# Patient Record
Sex: Female | Born: 1945
Health system: Southern US, Community
[De-identification: ages and names within clinical notes are randomized; demographics above are authoritative.]

## PROBLEM LIST (undated history)

## (undated) DIAGNOSIS — E669 Obesity, unspecified: Secondary | ICD-10-CM

## (undated) DIAGNOSIS — T8859XA Other complications of anesthesia, initial encounter: Secondary | ICD-10-CM

## (undated) DIAGNOSIS — F419 Anxiety disorder, unspecified: Secondary | ICD-10-CM

## (undated) DIAGNOSIS — T4145XA Adverse effect of unspecified anesthetic, initial encounter: Secondary | ICD-10-CM

## (undated) DIAGNOSIS — R569 Unspecified convulsions: Secondary | ICD-10-CM

## (undated) DIAGNOSIS — K219 Gastro-esophageal reflux disease without esophagitis: Secondary | ICD-10-CM

## (undated) DIAGNOSIS — I10 Essential (primary) hypertension: Secondary | ICD-10-CM

## (undated) DIAGNOSIS — E039 Hypothyroidism, unspecified: Secondary | ICD-10-CM

## (undated) DIAGNOSIS — M1712 Unilateral primary osteoarthritis, left knee: Secondary | ICD-10-CM

## (undated) DIAGNOSIS — M1990 Unspecified osteoarthritis, unspecified site: Secondary | ICD-10-CM

## (undated) DIAGNOSIS — R0602 Shortness of breath: Secondary | ICD-10-CM

## (undated) DIAGNOSIS — G500 Trigeminal neuralgia: Secondary | ICD-10-CM

## (undated) DIAGNOSIS — IMO0001 Reserved for inherently not codable concepts without codable children: Secondary | ICD-10-CM

## (undated) DIAGNOSIS — G4733 Obstructive sleep apnea (adult) (pediatric): Secondary | ICD-10-CM

## (undated) DIAGNOSIS — F32A Depression, unspecified: Secondary | ICD-10-CM

## (undated) DIAGNOSIS — I890 Lymphedema, not elsewhere classified: Secondary | ICD-10-CM

## (undated) DIAGNOSIS — F329 Major depressive disorder, single episode, unspecified: Secondary | ICD-10-CM

## (undated) DIAGNOSIS — E119 Type 2 diabetes mellitus without complications: Secondary | ICD-10-CM

## (undated) DIAGNOSIS — E559 Vitamin D deficiency, unspecified: Secondary | ICD-10-CM

## (undated) DIAGNOSIS — R32 Unspecified urinary incontinence: Secondary | ICD-10-CM

## (undated) DIAGNOSIS — E7800 Pure hypercholesterolemia, unspecified: Secondary | ICD-10-CM

## (undated) DIAGNOSIS — J189 Pneumonia, unspecified organism: Secondary | ICD-10-CM

## (undated) DIAGNOSIS — D649 Anemia, unspecified: Secondary | ICD-10-CM

## (undated) DIAGNOSIS — M1711 Unilateral primary osteoarthritis, right knee: Secondary | ICD-10-CM

## (undated) DIAGNOSIS — Z9114 Patient's other noncompliance with medication regimen: Secondary | ICD-10-CM

## (undated) HISTORY — DX: Obesity, unspecified: E66.9

## (undated) HISTORY — DX: Unspecified osteoarthritis, unspecified site: M19.90

## (undated) HISTORY — DX: Unspecified convulsions: R56.9

## (undated) HISTORY — PX: TONSILLECTOMY: SUR1361

## (undated) HISTORY — DX: Trigeminal neuralgia: G50.0

## (undated) HISTORY — DX: Shortness of breath: R06.02

## (undated) HISTORY — DX: Obstructive sleep apnea (adult) (pediatric): G47.33

## (undated) HISTORY — DX: Gastro-esophageal reflux disease without esophagitis: K21.9

## (undated) HISTORY — PX: SHOULDER SURGERY: SHX246

## (undated) HISTORY — DX: Pure hypercholesterolemia, unspecified: E78.00

## (undated) HISTORY — DX: Type 2 diabetes mellitus without complications: E11.9

## (undated) HISTORY — DX: Lymphedema, not elsewhere classified: I89.0

## (undated) HISTORY — PX: CHOLECYSTECTOMY: SHX55

## (undated) HISTORY — PX: CARPAL TUNNEL RELEASE: SHX101

## (undated) HISTORY — DX: Vitamin D deficiency, unspecified: E55.9

## (undated) HISTORY — DX: Anemia, unspecified: D64.9

## (undated) HISTORY — DX: Reserved for inherently not codable concepts without codable children: IMO0001

## (undated) HISTORY — DX: Hypothyroidism, unspecified: E03.9

## (undated) HISTORY — DX: Major depressive disorder, single episode, unspecified: F32.9

## (undated) HISTORY — DX: Depression, unspecified: F32.A

## (undated) HISTORY — PX: OVARIAN CYST REMOVAL: SHX89

## (undated) HISTORY — PX: TUBAL LIGATION: SHX77

## (undated) HISTORY — PX: COLONOSCOPY: SHX174

## (undated) HISTORY — PX: APPENDECTOMY: SHX54

## (undated) HISTORY — DX: Essential (primary) hypertension: I10

---

## 2007-05-30 ENCOUNTER — Encounter: Admission: RE | Admit: 2007-05-30 | Discharge: 2007-07-20 | Payer: Self-pay | Admitting: *Deleted

## 2010-06-29 ENCOUNTER — Encounter: Admission: RE | Admit: 2010-06-29 | Discharge: 2010-06-29 | Payer: Self-pay | Admitting: *Deleted

## 2010-09-01 ENCOUNTER — Other Ambulatory Visit: Admission: RE | Admit: 2010-09-01 | Discharge: 2010-09-01 | Payer: Self-pay | Admitting: *Deleted

## 2010-10-08 ENCOUNTER — Encounter: Admission: RE | Admit: 2010-10-08 | Discharge: 2010-10-08 | Payer: Self-pay | Admitting: Orthopedic Surgery

## 2010-10-15 ENCOUNTER — Inpatient Hospital Stay (HOSPITAL_COMMUNITY): Admission: RE | Admit: 2010-10-15 | Discharge: 2010-10-18 | Payer: Self-pay | Admitting: Orthopedic Surgery

## 2011-03-03 LAB — CBC
HCT: 29 % — ABNORMAL LOW (ref 36.0–46.0)
HCT: 30.8 % — ABNORMAL LOW (ref 36.0–46.0)
Hemoglobin: 9.6 g/dL — ABNORMAL LOW (ref 12.0–15.0)
MCV: 92.7 fL (ref 78.0–100.0)
MCV: 95.1 fL (ref 78.0–100.0)
Platelets: 186 10*3/uL (ref 150–400)
Platelets: 227 10*3/uL (ref 150–400)
RBC: 3.24 MIL/uL — ABNORMAL LOW (ref 3.87–5.11)
RBC: 4.08 MIL/uL (ref 3.87–5.11)
RDW: 12.8 % (ref 11.5–15.5)
RDW: 13.1 % (ref 11.5–15.5)
WBC: 10.1 10*3/uL (ref 4.0–10.5)
WBC: 7.3 10*3/uL (ref 4.0–10.5)
WBC: 8.4 10*3/uL (ref 4.0–10.5)

## 2011-03-03 LAB — PROTIME-INR
INR: 1.01 (ref 0.00–1.49)
Prothrombin Time: 13.5 seconds (ref 11.6–15.2)

## 2011-03-03 LAB — BASIC METABOLIC PANEL
BUN: 12 mg/dL (ref 6–23)
BUN: 15 mg/dL (ref 6–23)
Calcium: 9.8 mg/dL (ref 8.4–10.5)
Chloride: 101 mEq/L (ref 96–112)
Chloride: 102 mEq/L (ref 96–112)
Chloride: 103 mEq/L (ref 96–112)
Creatinine, Ser: 1 mg/dL (ref 0.4–1.2)
GFR calc Af Amer: >60 mL/min (ref >60)
GFR calc Af Amer: >60 mL/min (ref >60)
GFR calc non Af Amer: 56 mL/min — ABNORMAL LOW (ref >60)
GFR calc non Af Amer: >60 mL/min (ref >60)
GFR calc non Af Amer: >60 mL/min (ref >60)
Glucose, Bld: 117 mg/dL — ABNORMAL HIGH (ref 70–99)
Potassium: 4.4 mEq/L (ref 3.5–5.1)
Potassium: 5 mEq/L (ref 3.5–5.1)
Sodium: 133 mEq/L — ABNORMAL LOW (ref 135–145)

## 2011-03-03 LAB — APTT: aPTT: 28 seconds (ref 24–37)

## 2011-03-03 LAB — GLUCOSE, CAPILLARY: Glucose-Capillary: 175 mg/dL — ABNORMAL HIGH (ref 70–99)

## 2011-03-03 LAB — MRSA PCR SCREENING: MRSA by PCR: NEGATIVE

## 2011-03-03 LAB — SURGICAL PCR SCREEN: Staphylococcus aureus: POSITIVE — AB

## 2011-03-03 LAB — TYPE AND SCREEN: Antibody Screen: NEGATIVE

## 2011-07-26 ENCOUNTER — Other Ambulatory Visit: Payer: Self-pay | Admitting: Orthopedic Surgery

## 2011-07-26 DIAGNOSIS — M1990 Unspecified osteoarthritis, unspecified site: Secondary | ICD-10-CM

## 2011-07-27 ENCOUNTER — Ambulatory Visit
Admission: RE | Admit: 2011-07-27 | Discharge: 2011-07-27 | Disposition: A | Discharge status: Home / Self Care | Payer: 59 | Source: Ambulatory Visit | Attending: Orthopedic Surgery | Admitting: Orthopedic Surgery

## 2011-07-27 DIAGNOSIS — M1990 Unspecified osteoarthritis, unspecified site: Secondary | ICD-10-CM

## 2011-07-28 ENCOUNTER — Encounter (HOSPITAL_COMMUNITY)
Admission: RE | Admit: 2011-07-28 | Discharge: 2011-07-28 | Disposition: A | Discharge status: Home / Self Care | Payer: 59 | Source: Ambulatory Visit | Attending: Orthopedic Surgery | Admitting: Orthopedic Surgery

## 2011-07-28 ENCOUNTER — Other Ambulatory Visit (HOSPITAL_COMMUNITY): Payer: Self-pay | Admitting: Orthopedic Surgery

## 2011-07-28 ENCOUNTER — Ambulatory Visit (HOSPITAL_COMMUNITY)
Admission: RE | Admit: 2011-07-28 | Discharge: 2011-07-28 | Disposition: A | Discharge status: Home / Self Care | Payer: 59 | Source: Ambulatory Visit | Attending: Orthopedic Surgery | Admitting: Orthopedic Surgery

## 2011-07-28 DIAGNOSIS — M19011 Primary osteoarthritis, right shoulder: Secondary | ICD-10-CM

## 2011-07-28 DIAGNOSIS — Z01811 Encounter for preprocedural respiratory examination: Secondary | ICD-10-CM | POA: Insufficient documentation

## 2011-07-28 DIAGNOSIS — Z01818 Encounter for other preprocedural examination: Secondary | ICD-10-CM | POA: Insufficient documentation

## 2011-07-28 DIAGNOSIS — Z01812 Encounter for preprocedural laboratory examination: Secondary | ICD-10-CM | POA: Insufficient documentation

## 2011-07-28 DIAGNOSIS — M19019 Primary osteoarthritis, unspecified shoulder: Secondary | ICD-10-CM | POA: Insufficient documentation

## 2011-07-28 LAB — COMPREHENSIVE METABOLIC PANEL
AST: 18 U/L (ref 0–37)
Albumin: 3.9 g/dL (ref 3.5–5.2)
Alkaline Phosphatase: 67 U/L (ref 39–117)
BUN: 25 mg/dL — ABNORMAL HIGH (ref 6–23)
CO2: 28 mEq/L (ref 19–32)
Calcium: 10.1 mg/dL (ref 8.4–10.5)
Chloride: 103 mEq/L (ref 96–112)
GFR calc Af Amer: >60 mL/min (ref >60)
GFR calc non Af Amer: 59 mL/min — ABNORMAL LOW (ref >60)
Glucose, Bld: 113 mg/dL — ABNORMAL HIGH (ref 70–99)
Potassium: 4.4 mEq/L (ref 3.5–5.1)
Total Bilirubin: 0.5 mg/dL (ref 0.3–1.2)

## 2011-07-28 LAB — CBC
Hemoglobin: 12.9 g/dL (ref 12.0–15.0)
MCH: 30.6 pg (ref 26.0–34.0)
MCHC: 33.9 g/dL (ref 30.0–36.0)
MCV: 90.3 fL (ref 78.0–100.0)
RBC: 4.21 MIL/uL (ref 3.87–5.11)

## 2011-07-28 LAB — SURGICAL PCR SCREEN: Staphylococcus aureus: NEGATIVE

## 2011-07-28 LAB — TYPE AND SCREEN: ABO/RH(D): A POS

## 2011-08-03 ENCOUNTER — Inpatient Hospital Stay (HOSPITAL_COMMUNITY): Payer: 59

## 2011-08-03 ENCOUNTER — Inpatient Hospital Stay (HOSPITAL_COMMUNITY)
Admission: RE | Admit: 2011-08-03 | Discharge: 2011-08-05 | DRG: 484 | Disposition: A | Discharge status: Home / Self Care | Payer: 59 | Source: Ambulatory Visit | Attending: Orthopedic Surgery | Admitting: Orthopedic Surgery

## 2011-08-03 DIAGNOSIS — M19019 Primary osteoarthritis, unspecified shoulder: Principal | ICD-10-CM | POA: Diagnosis present

## 2011-08-03 DIAGNOSIS — I10 Essential (primary) hypertension: Secondary | ICD-10-CM | POA: Diagnosis present

## 2011-08-03 DIAGNOSIS — G4730 Sleep apnea, unspecified: Secondary | ICD-10-CM | POA: Diagnosis present

## 2011-08-04 LAB — BASIC METABOLIC PANEL
BUN: 18 mg/dL (ref 6–23)
Calcium: 8.6 mg/dL (ref 8.4–10.5)
GFR calc Af Amer: >60 mL/min (ref >60)
GFR calc non Af Amer: >60 mL/min (ref >60)
Potassium: 5.3 mEq/L — ABNORMAL HIGH (ref 3.5–5.1)
Sodium: 138 mEq/L (ref 135–145)

## 2011-08-04 LAB — CBC
MCH: 31.5 pg (ref 26.0–34.0)
MCHC: 33.8 g/dL (ref 30.0–36.0)
RDW: 13.2 % (ref 11.5–15.5)

## 2011-08-04 NOTE — Op Note (Signed)
NAMEJENELLA, [name]                ACCOUNT NO.:  [id number]  MEDICAL RECORD NO.:  [id number]  LOCATION:  5017                         FACILITY:  [facility]  PHYSICIAN:  Eulas Post, MD    DATE OF BIRTH:  03-Apr-1946  DATE OF PROCEDURE:  08/03/2011 DATE OF DISCHARGE:                              OPERATIVE REPORT   SURGEON:  Eulas Post, MD  ASSISTANT:  Janace Litten, OPA-C, present and scrubbed throughout the case and critical for assistance with exposure as well as retraction, manipulation, instrumentation, and closure.  PREOPERATIVE DIAGNOSIS:  Right shoulder osteoarthritis.  POSTOPERATIVE DIAGNOSIS:  Right shoulder osteoarthritis.  OPERATIVE PROCEDURE:  Right total shoulder arthroplasty.  ANESTHESIA:  General with a regional block.  OPERATIVE IMPLANTS:  Biomet size 10 Press-Fit humeral stem with a small glenoid and a central Regenerex peg with a 42 x 18 mm Versa-Dial humeral head and 1 bag of DePuy CMW 1 bone cement for the glenoid.  PREOPERATIVE INDICATIONS:  Kathy [name] is a 65 year old woman who complained of severe right shoulder pain with arthritis and failed conservative measures.  She has previously had a left total shoulder replacement.  She elected for total shoulder replacement on the right. The risks, benefits, and alternatives were discussed before the procedure including but not limited to the risks of infection, bleeding, nerve injury, dislocation, periprosthetic fracture, the need for revision surgery, incomplete relief of pain, stiffness, cardiopulmonary complications, among others and she is willing to proceed.  OPERATIVE PROCEDURE:  The patient was brought to the operating room and placed in supine position.  General anesthesia was administered. Regional block had been performed.  She did not need an awake intubation at this time and anesthesia was able to manage her airway.  Foley was placed, and this was done on the first pass.  She was  positioned on the table in a beach-chair position and all bony prominences were padded. Due to her size, this made the case as well as positioning extremely difficult, however, I had been through this before with her and therefore had every one prepared and appropriately organized.  After a sterile prep and drape, time-out was performed and deltopectoral approach was carried out.  Cephalic vein was identified and retracted laterally.  During the portion of the case, the cephalic vein was injured such that it had to be tied off.  Access to the deep structures was extremely difficult.  I placed deep retractors and exposed the biceps tendon and then performed biceps tenodesis to the pectoralis insertion.  I then used the subscapularis tendon cutting approach and then exposed the proximal humerus and removed all the osteophytes and removed circumferentially the bone spurs as well as releasing the capsule.  I then dislocated the humeral head and then reamed up to a size 10.  I placed a size 9 on the other side, but it tended comfortably to go down, so then I positioned my jig and cut the humeral head in 30 degrees of retroversion.  Initially, this was somewhat of a conservative cut, but after trying to progress to the glenoid I found that there was still restricted access to the glenoid, so I resected slightly  more proximally.  I did not resect any of the infraspinatus and the capsular cuff went directly to the resection line.  I then turned my attention of the glenoid, and deep retractors were placed and I resected the labrum circumferentially and cleaned up the biceps cut.  Initially, I tried with the [facility] retractor, but this did not provide adequate retraction and the humeral head continued posing an obstruction through the head of the [facility], so therefore I used a Sonnabend retractor to get the humeral head out of the way.  I did get adequate access to the glenoid, and performed  circumferential releases, taking care to protect the axillary nerve.  I then drilled with my central drill, and then reamed taking care not to take too much superior bone as there was a little superior bone loss.  I also took care not to ream too deeply, in order to protect the subchondral plate.  I then placed my drill guide and I drilled my 3 holes for the pegged glenoid and then drilled my central hole for the Regenerex peg.  The posterior hole did penetrate, and I was slightly low with the placement of the glenoid, but overall satisfactory position.  Version was appropriate and neutral based on my preoperative template CT.  I then cemented the real component into place and it had excellent stability.  I used cement in the peripheral holes, but not the central hole.  I then turned my attention to the humerus.  Once the cement had cured, I delivered the humerus and protected the glenoid component and sequentially broached.  There had been some damage to the proximal humerus done by the humeral head retractors, and so my metaphysis was slightly weakened, but overall I had satisfactory bone adequate for press-fit fixation.  There was rotational stability with the implant. Therefore, I sequentially broached and the size 10 gave better hold and so therefore I malleted the size 10 real implant into place.  This had satisfactory fixation, and I did place a little bit of bone graft around the proximal neck.  I then trialed and selected the above-named component.  This was dialed in at C.  I then put the real head on, irrigated the shoulder copiously, reduced it, and turned my attention to closure.  Appropriate soft tissue tension had been restored.  I repaired the subscapularis with interrupted #2 FiberWire in a figure-of-eight pattern, a total of 4 stitches utilized, one of which was in the rotator interval.  Excellent tendon repair had been achieved.  I irrigated again copiously  and repaired the deltopectoral interval with Vicryl followed by Vicryl and Monocryl for the skin with Steri-Strips and sterile gauze.  She was awakened and returned to PACU in stable and satisfactory condition. There were no complications.  She tolerated the procedure well.     Eulas Post, MD     JPL/MEDQ  D:  08/03/2011  T:  08/03/2011  Job:  [id number]  Electronically Signed by Teryl Lucy MD on 08/04/2011 08:05:24 AM

## 2011-08-06 NOTE — Discharge Summary (Signed)
  [name], [name]                ACCOUNT NO.:  [id number]  MEDICAL RECORD NO.:  [id number]  LOCATION:  5017                         FACILITY:  [facility]  PHYSICIAN:  Eulas Post, MD    DATE OF BIRTH:  [birth year]  DATE OF ADMISSION:  08/03/2011 DATE OF DISCHARGE:  08/05/2011                              DISCHARGE SUMMARY   ADMISSION DIAGNOSES: 1. Right shoulder osteoarthritis. 2. Morbid obesity.  DISCHARGE DIAGNOSES: 1. Right shoulder osteoarthritis. 2. Morbid obesity.  PRIMARY PROCEDURE:  Right total shoulder arthroplasty.  HOSPITAL COURSE:  Ms. Kathy Young is a 65 year old morbidly obese woman who elected for right total shoulder arthroplasty.  She tolerated procedure well and postoperatively did not have any complications.  She was given perioperative antibiotics for antimicrobial prophylaxis.  She was given sequential compression devices and early ambulation for DVT prophylaxis.  Her pain was initially controlled with a Dilaudid PCA and then switched to p.o. analgesics.  She was tolerating p.o., and able to ambulate, and is planned to be discharged home with followup with me in 2 weeks.  There were no complications.  She tolerated the procedure well.  She benefited maximally from her hospital stay and was improved upon discharge.     Eulas Post, MD     JPL/MEDQ  D:  08/05/2011  T:  08/06/2011  Job:  [id number]  Electronically Signed by Teryl Lucy MD on 08/06/2011 02:39:41 PM

## 2013-07-31 ENCOUNTER — Other Ambulatory Visit: Payer: Self-pay | Admitting: Family Medicine

## 2013-07-31 DIAGNOSIS — H539 Unspecified visual disturbance: Secondary | ICD-10-CM

## 2013-07-31 DIAGNOSIS — R531 Weakness: Secondary | ICD-10-CM

## 2013-08-07 ENCOUNTER — Ambulatory Visit
Admission: RE | Admit: 2013-08-07 | Discharge: 2013-08-07 | Disposition: A | Discharge status: Home / Self Care | Payer: 59 | Source: Ambulatory Visit | Attending: Family Medicine | Admitting: Family Medicine

## 2013-08-07 DIAGNOSIS — R531 Weakness: Secondary | ICD-10-CM

## 2013-08-07 DIAGNOSIS — H539 Unspecified visual disturbance: Secondary | ICD-10-CM

## 2013-11-23 ENCOUNTER — Other Ambulatory Visit: Payer: Self-pay | Admitting: Family Medicine

## 2013-11-23 DIAGNOSIS — Z1231 Encounter for screening mammogram for malignant neoplasm of breast: Secondary | ICD-10-CM

## 2014-01-16 ENCOUNTER — Ambulatory Visit: Payer: 59

## 2014-08-04 ENCOUNTER — Encounter: Payer: Self-pay | Admitting: *Deleted

## 2014-11-22 ENCOUNTER — Ambulatory Visit (INDEPENDENT_AMBULATORY_CARE_PROVIDER_SITE_OTHER): Payer: 59 | Admitting: Licensed Clinical Social Worker

## 2014-11-22 DIAGNOSIS — F332 Major depressive disorder, recurrent severe without psychotic features: Secondary | ICD-10-CM

## 2014-12-06 ENCOUNTER — Ambulatory Visit (INDEPENDENT_AMBULATORY_CARE_PROVIDER_SITE_OTHER): Payer: 59 | Admitting: Licensed Clinical Social Worker

## 2014-12-06 DIAGNOSIS — F331 Major depressive disorder, recurrent, moderate: Secondary | ICD-10-CM

## 2014-12-23 ENCOUNTER — Ambulatory Visit: Payer: 59 | Admitting: Licensed Clinical Social Worker

## 2014-12-27 ENCOUNTER — Ambulatory Visit (INDEPENDENT_AMBULATORY_CARE_PROVIDER_SITE_OTHER): Payer: 59 | Admitting: Licensed Clinical Social Worker

## 2014-12-27 DIAGNOSIS — F331 Major depressive disorder, recurrent, moderate: Secondary | ICD-10-CM

## 2015-03-04 ENCOUNTER — Other Ambulatory Visit: Payer: Self-pay | Admitting: Family Medicine

## 2015-03-04 DIAGNOSIS — Z1231 Encounter for screening mammogram for malignant neoplasm of breast: Secondary | ICD-10-CM

## 2015-03-21 ENCOUNTER — Ambulatory Visit: Payer: Self-pay

## 2015-04-25 DIAGNOSIS — R635 Abnormal weight gain: Secondary | ICD-10-CM | POA: Diagnosis not present

## 2015-05-01 DIAGNOSIS — Z6841 Body Mass Index (BMI) 40.0 and over, adult: Secondary | ICD-10-CM | POA: Diagnosis not present

## 2015-05-01 DIAGNOSIS — R350 Frequency of micturition: Secondary | ICD-10-CM | POA: Diagnosis not present

## 2015-05-01 DIAGNOSIS — R635 Abnormal weight gain: Secondary | ICD-10-CM | POA: Diagnosis not present

## 2015-05-01 DIAGNOSIS — E119 Type 2 diabetes mellitus without complications: Secondary | ICD-10-CM | POA: Diagnosis not present

## 2015-05-01 DIAGNOSIS — G4733 Obstructive sleep apnea (adult) (pediatric): Secondary | ICD-10-CM | POA: Diagnosis not present

## 2015-05-01 DIAGNOSIS — E039 Hypothyroidism, unspecified: Secondary | ICD-10-CM | POA: Diagnosis not present

## 2015-05-01 DIAGNOSIS — F411 Generalized anxiety disorder: Secondary | ICD-10-CM | POA: Diagnosis not present

## 2015-05-01 DIAGNOSIS — I10 Essential (primary) hypertension: Secondary | ICD-10-CM | POA: Insufficient documentation

## 2015-05-01 DIAGNOSIS — M170 Bilateral primary osteoarthritis of knee: Secondary | ICD-10-CM | POA: Insufficient documentation

## 2015-05-22 DIAGNOSIS — Z6841 Body Mass Index (BMI) 40.0 and over, adult: Secondary | ICD-10-CM | POA: Diagnosis not present

## 2015-05-22 DIAGNOSIS — F411 Generalized anxiety disorder: Secondary | ICD-10-CM | POA: Diagnosis not present

## 2015-05-22 DIAGNOSIS — R635 Abnormal weight gain: Secondary | ICD-10-CM | POA: Diagnosis not present

## 2015-05-22 DIAGNOSIS — E039 Hypothyroidism, unspecified: Secondary | ICD-10-CM | POA: Diagnosis not present

## 2015-05-26 DIAGNOSIS — M79604 Pain in right leg: Secondary | ICD-10-CM | POA: Diagnosis not present

## 2015-05-26 DIAGNOSIS — R600 Localized edema: Secondary | ICD-10-CM | POA: Diagnosis not present

## 2015-06-06 ENCOUNTER — Other Ambulatory Visit: Payer: Self-pay | Admitting: *Deleted

## 2015-06-06 DIAGNOSIS — M7989 Other specified soft tissue disorders: Secondary | ICD-10-CM

## 2015-06-19 DIAGNOSIS — I10 Essential (primary) hypertension: Secondary | ICD-10-CM | POA: Diagnosis not present

## 2015-06-19 DIAGNOSIS — Z6841 Body Mass Index (BMI) 40.0 and over, adult: Secondary | ICD-10-CM | POA: Diagnosis not present

## 2015-06-19 DIAGNOSIS — M170 Bilateral primary osteoarthritis of knee: Secondary | ICD-10-CM | POA: Diagnosis not present

## 2015-06-19 DIAGNOSIS — R635 Abnormal weight gain: Secondary | ICD-10-CM | POA: Diagnosis not present

## 2015-06-19 DIAGNOSIS — E119 Type 2 diabetes mellitus without complications: Secondary | ICD-10-CM | POA: Diagnosis not present

## 2015-07-17 DIAGNOSIS — Z6841 Body Mass Index (BMI) 40.0 and over, adult: Secondary | ICD-10-CM | POA: Diagnosis not present

## 2015-07-17 DIAGNOSIS — E119 Type 2 diabetes mellitus without complications: Secondary | ICD-10-CM | POA: Diagnosis not present

## 2015-07-17 DIAGNOSIS — I10 Essential (primary) hypertension: Secondary | ICD-10-CM | POA: Diagnosis not present

## 2015-07-17 DIAGNOSIS — M170 Bilateral primary osteoarthritis of knee: Secondary | ICD-10-CM | POA: Diagnosis not present

## 2015-07-17 DIAGNOSIS — R635 Abnormal weight gain: Secondary | ICD-10-CM | POA: Diagnosis not present

## 2015-07-17 DIAGNOSIS — F411 Generalized anxiety disorder: Secondary | ICD-10-CM | POA: Diagnosis not present

## 2015-07-31 DIAGNOSIS — Z6841 Body Mass Index (BMI) 40.0 and over, adult: Secondary | ICD-10-CM | POA: Diagnosis not present

## 2015-07-31 DIAGNOSIS — Z713 Dietary counseling and surveillance: Secondary | ICD-10-CM | POA: Diagnosis not present

## 2015-08-01 ENCOUNTER — Encounter: Payer: Self-pay | Admitting: Vascular Surgery

## 2015-08-01 ENCOUNTER — Encounter (HOSPITAL_COMMUNITY): Payer: Self-pay

## 2015-08-13 DIAGNOSIS — I10 Essential (primary) hypertension: Secondary | ICD-10-CM | POA: Diagnosis not present

## 2015-08-13 DIAGNOSIS — Z6841 Body Mass Index (BMI) 40.0 and over, adult: Secondary | ICD-10-CM | POA: Diagnosis not present

## 2015-08-13 DIAGNOSIS — M170 Bilateral primary osteoarthritis of knee: Secondary | ICD-10-CM | POA: Diagnosis not present

## 2015-08-13 DIAGNOSIS — R635 Abnormal weight gain: Secondary | ICD-10-CM | POA: Diagnosis not present

## 2015-08-13 DIAGNOSIS — E119 Type 2 diabetes mellitus without complications: Secondary | ICD-10-CM | POA: Diagnosis not present

## 2015-08-15 ENCOUNTER — Encounter: Payer: Self-pay | Admitting: Surgery

## 2015-08-18 ENCOUNTER — Encounter: Payer: Self-pay | Admitting: Surgery

## 2015-08-18 ENCOUNTER — Ambulatory Visit (HOSPITAL_COMMUNITY)
Admission: RE | Admit: 2015-08-18 | Discharge: 2015-08-18 | Disposition: A | Discharge status: Home / Self Care | Payer: Medicare Other | Source: Ambulatory Visit | Attending: Vascular Surgery | Admitting: Vascular Surgery

## 2015-08-18 ENCOUNTER — Ambulatory Visit (INDEPENDENT_AMBULATORY_CARE_PROVIDER_SITE_OTHER): Payer: Medicare Other | Admitting: Surgery

## 2015-08-18 VITALS — BP 126/73 | HR 76 | Ht 69.5 in | Wt 333.0 lb

## 2015-08-18 DIAGNOSIS — M7989 Other specified soft tissue disorders: Secondary | ICD-10-CM | POA: Diagnosis not present

## 2015-08-18 DIAGNOSIS — I8392 Asymptomatic varicose veins of left lower extremity: Secondary | ICD-10-CM | POA: Diagnosis not present

## 2015-08-18 DIAGNOSIS — I872 Venous insufficiency (chronic) (peripheral): Secondary | ICD-10-CM | POA: Diagnosis not present

## 2015-08-18 NOTE — Progress Notes (Signed)
[facility] [place]   Reason for referral: Swollen right leg  History of Present Illness  Kathy Young is a 69 y.o. female who presents with chief complaint: swollen leg.  Patient notes, onset of swelling 4 months ago, associated with weight loss.  The patient has had no history of DVT, no history of varicose vein, no history of venous stasis ulcers, no history of  Lymphedema and positive erythema and edema causing skin changes in lower legs.  There is no family history of venous disorders.  The patient has  used compression stockings in the past.  She currently uses 30-40 mmhg knee high and has for the last eight years.  She states she has no pain or discoloration in the right thigh she just noted that it was bigger than the right.  She has been on a diet program physician driven to prepare for TKA in the future and has lost 30 plus pounds since May 2016.  Past medical history includes hypertension managed with  Lisinopril, DM managed with metformin, and hypercholesterolemia managed with Lipitor.    Past Medical History  Diagnosis Date  . Diabetes mellitus without complication   . Obesity   . Hypertension   . Hypothyroidism   . Vitamin D deficiency   . OSA (obstructive sleep apnea)   . Lymphedema   . Seizures   . Trigeminal neuralgia   . Arthritis   . Depression   . Anemia   . SOB (shortness of breath)   . Reflux   . Hypercholesteremia     Past Surgical History  Procedure Laterality Date  . Cholecystectomy    . Shoulder surgery    . Carpal tunnel release    . Ovarian cyst removal      Social History   Social History  . Marital Status: Divorced    Spouse Name: N/A  . Number of Children: N/A  . Years of Education: N/A   Occupational History  . Not on file.   Social History Main Topics  . Smoking status: Never Smoker   . Smokeless tobacco: Not on file  . Alcohol Use: No  . Drug Use: No  . Sexual Activity: Not on file   Other Topics Concern   . Not on file   Social History Narrative    Family History  Problem Relation Age of Onset  . Cancer Mother   . Hypertension Mother   . Diabetes Father   . Heart disease Father   . Heart attack Father   . Diabetes Sister   . Diabetes Brother     Current Outpatient Prescriptions on File Prior to Visit  Medication Sig Dispense Refill  . atorvastatin (LIPITOR) 10 MG tablet Take 10 mg by mouth daily.    . Cholecalciferol (VITAMIN D3) 5000 UNITS CAPS Take by mouth daily.    . diclofenac sodium (VOLTAREN) 1 % GEL Apply topically as needed.    Marland Kitchen escitalopram (LEXAPRO) 20 MG tablet Take 20 mg by mouth daily.    Marland Kitchen gabapentin (NEURONTIN) 400 MG capsule Take 400 mg by mouth 2 (two) times daily.    Marland Kitchen ibuprofen (ADVIL,MOTRIN) 800 MG tablet Take 800 mg by mouth daily.    Marland Kitchen levothyroxine (SYNTHROID, LEVOTHROID) 200 MCG tablet Take 200 mcg by mouth daily before breakfast.    . lisinopril (PRINIVIL,ZESTRIL) 20 MG tablet Take 20 mg by mouth daily.    . metFORMIN (GLUCOPHAGE) 500 MG tablet Take 500 mg by mouth 2 (two) times  daily with a meal.    . Multiple Vitamin (MULTIVITAMIN) tablet Take 1 tablet by mouth daily.    Marland Kitchen oxybutynin (DITROPAN XL) 15 MG 24 hr tablet Take 15 mg by mouth at bedtime.    . traMADol (ULTRAM) 50 MG tablet Take 50 mg by mouth every 6 (six) hours as needed.     No current facility-administered medications on file prior to visit.    Allergies as of 08/18/2015 - Review Complete 08/18/2015  Allergen Reaction Noted  . Dilantin [phenytoin sodium extended]  08/04/2014  . Tegretol [carbamazepine]  08/04/2014     ROS:   General:  No unexplained  weight loss, Fever, chills  HEENT: No recent headaches, no nasal bleeding, no visual changes, no sore throat  Neurologic: No dizziness, blackouts, seizures. No recent symptoms of stroke or mini- stroke. No recent episodes of slurred speech, or temporary blindness.  Cardiac: No recent episodes of chest pain/pressure, no shortness  of breath at rest.  No shortness of breath with exertion.  Denies history of atrial fibrillation or irregular heartbeat  Vascular: No history of rest pain in feet.  No history of claudication.  No history of non-healing ulcer, No history of DVT   Pulmonary: No home oxygen, no productive cough, no hemoptysis,  No asthma or wheezing  Musculoskeletal:  [x ] Arthritis, [ ]  Low back pain,  [ ]  Joint pain  Hematologic:No history of hypercoagulable state.  No history of easy bleeding.  No history of anemia  Gastrointestinal: No hematochezia or melena,  No gastroesophageal reflux, no trouble swallowing  Urinary: [ ]  chronic Kidney disease, [ ]  on HD - [ ]  MWF or [ ]  TTHS, [ ]  Burning with urination, [ ]  Frequent urination, [ ]  Difficulty urinating;   Skin: erythema bilateral LE no open wounds  Psychological: No history of anxiety,  No history of depression  Physical Examination  Filed Vitals:   08/18/15 1337 08/18/15 1342  BP: 146/70 126/73  Pulse: 76   Height: 5' 9.5" (1.765 m)   Weight: 333 lb (151.048 kg)   SpO2: 98%     Body mass index is 48.49 kg/(m^2).  General:  Alert and oriented, no acute distress HEENT: Normal Neck: No bruit or JVD Pulmonary: Clear to auscultation bilaterally Cardiac: Regular Rate and Rhythm without murmur Abdomen: Soft, non-tender, non-distended, no mass, no scars Skin: No rash, LE erythema chronic edema Extremity Pulses:  2+ radial and dorsalis pedis  pulses bilaterally Musculoskeletal: Right thigh edema and bilateral LE erythema without open wounds  Neurologic: Upper and lower extremity motor 5/5 and symmetric  DATA: NO DVT Deep vein reflux right and minimal superficial reflux SFJ  Assessment: Deep vein reflux Chronic edema Bilateral LE   Plan: She has palpable DP pulses and is not at risk of limb loss. Compression stockings 30-40 mm hg daily Daily exercise and continued healthy eating habits.   This deep venous reflux should not interfere  with her future TKA plans.   F/U PRN  Thomasena Edis, Aleka Twitty MAUREEN PA-C [facility]  The patient was seen in conjunction with Dr. Myra Gianotti   I agree with the above.  I have seen and evaluated the patient.  She has chronic lower extremity edema.  She wears compression stockings religiously.  On examination she does have skin changes which are somewhat concerning area there is no active ulceration there is just discoloration.  Venous reflux evaluation today did not show any significant superficial venous insufficiency, however she did  have deep vein insufficiency.  For that reason the patient's will have to be managed with elevation and compression.  Consideration also for referral to lymphedema clinic.  Durene Cal

## 2015-09-02 ENCOUNTER — Emergency Department (HOSPITAL_COMMUNITY)
Admission: EM | Admit: 2015-09-02 | Discharge: 2015-09-02 | Disposition: A | Discharge status: Home / Self Care | Payer: Medicare Other | Attending: Emergency Medicine | Admitting: Emergency Medicine

## 2015-09-02 ENCOUNTER — Encounter (HOSPITAL_COMMUNITY): Payer: Self-pay | Admitting: Emergency Medicine

## 2015-09-02 ENCOUNTER — Emergency Department (HOSPITAL_COMMUNITY): Payer: Medicare Other

## 2015-09-02 DIAGNOSIS — F329 Major depressive disorder, single episode, unspecified: Secondary | ICD-10-CM | POA: Diagnosis not present

## 2015-09-02 DIAGNOSIS — Z862 Personal history of diseases of the blood and blood-forming organs and certain disorders involving the immune mechanism: Secondary | ICD-10-CM | POA: Diagnosis not present

## 2015-09-02 DIAGNOSIS — R079 Chest pain, unspecified: Secondary | ICD-10-CM | POA: Diagnosis not present

## 2015-09-02 DIAGNOSIS — E669 Obesity, unspecified: Secondary | ICD-10-CM | POA: Diagnosis not present

## 2015-09-02 DIAGNOSIS — G500 Trigeminal neuralgia: Secondary | ICD-10-CM | POA: Diagnosis not present

## 2015-09-02 DIAGNOSIS — G40909 Epilepsy, unspecified, not intractable, without status epilepticus: Secondary | ICD-10-CM | POA: Diagnosis not present

## 2015-09-02 DIAGNOSIS — E119 Type 2 diabetes mellitus without complications: Secondary | ICD-10-CM | POA: Diagnosis not present

## 2015-09-02 DIAGNOSIS — Z8719 Personal history of other diseases of the digestive system: Secondary | ICD-10-CM | POA: Diagnosis not present

## 2015-09-02 DIAGNOSIS — M1990 Unspecified osteoarthritis, unspecified site: Secondary | ICD-10-CM | POA: Diagnosis not present

## 2015-09-02 DIAGNOSIS — E780 Pure hypercholesterolemia: Secondary | ICD-10-CM | POA: Diagnosis not present

## 2015-09-02 DIAGNOSIS — R0789 Other chest pain: Secondary | ICD-10-CM | POA: Diagnosis not present

## 2015-09-02 DIAGNOSIS — I10 Essential (primary) hypertension: Secondary | ICD-10-CM | POA: Diagnosis not present

## 2015-09-02 DIAGNOSIS — Z79899 Other long term (current) drug therapy: Secondary | ICD-10-CM | POA: Diagnosis not present

## 2015-09-02 DIAGNOSIS — E039 Hypothyroidism, unspecified: Secondary | ICD-10-CM | POA: Diagnosis not present

## 2015-09-02 LAB — CBC WITH DIFFERENTIAL/PLATELET
Basophils Absolute: 0 10*3/uL (ref 0.0–0.1)
Basophils Relative: 0 % (ref 0–1)
Eosinophils Absolute: 0.1 10*3/uL (ref 0.0–0.7)
Eosinophils Relative: 2 % (ref 0–5)
HCT: 36.1 % (ref 36.0–46.0)
Hemoglobin: 11.9 g/dL — ABNORMAL LOW (ref 12.0–15.0)
Lymphocytes Relative: 24 % (ref 12–46)
Lymphs Abs: 1.3 10*3/uL (ref 0.7–4.0)
MCH: 31.9 pg (ref 26.0–34.0)
MCHC: 33 g/dL (ref 30.0–36.0)
MCV: 96.8 fL (ref 78.0–100.0)
Monocytes Absolute: 0.6 10*3/uL (ref 0.1–1.0)
Monocytes Relative: 10 % (ref 3–12)
Neutro Abs: 3.5 10*3/uL (ref 1.7–7.7)
Neutrophils Relative %: 64 % (ref 43–77)
Platelets: 175 10*3/uL (ref 150–400)
RBC: 3.73 MIL/uL — ABNORMAL LOW (ref 3.87–5.11)
RDW: 12.4 % (ref 11.5–15.5)
WBC: 5.5 10*3/uL (ref 4.0–10.5)

## 2015-09-02 LAB — BASIC METABOLIC PANEL
Anion gap: 8 (ref 5–15)
BUN: 28 mg/dL — ABNORMAL HIGH (ref 6–20)
CO2: 26 mmol/L (ref 22–32)
Calcium: 9.7 mg/dL (ref 8.9–10.3)
Chloride: 107 mmol/L (ref 101–111)
Creatinine, Ser: 1.16 mg/dL — ABNORMAL HIGH (ref 0.44–1.00)
GFR calc Af Amer: 55 mL/min — ABNORMAL LOW (ref >60)
GFR calc non Af Amer: 47 mL/min — ABNORMAL LOW (ref >60)
Glucose, Bld: 104 mg/dL — ABNORMAL HIGH (ref 65–99)
Potassium: 4.3 mmol/L (ref 3.5–5.1)
Sodium: 141 mmol/L (ref 135–145)

## 2015-09-02 LAB — TROPONIN I
Troponin I: <0.03 ng/mL (ref <0.031)
Troponin I: <0.03 ng/mL (ref <0.031)

## 2015-09-02 NOTE — ED Notes (Signed)
MD at bedside. 

## 2015-09-02 NOTE — Discharge Instructions (Signed)

## 2015-09-02 NOTE — ED Provider Notes (Signed)
CSN: [id number]     Arrival date & time 09/02/15  1225 History   First MD Initiated Contact with Patient 09/02/15 1226     Chief Complaint  Patient presents with  . Chest Pain     (Consider location/radiation/quality/duration/timing/severity/associated sxs/prior Treatment) HPI   69 year old female chest pain. Onset shortly before arrival. Patient describes substernal pressure. Patient was participating a light workout during symptom onset. It began approximately 20-25 minutes after she began. Some radiation up into her anterior neck. No other associated symptoms such as nausea, diaphoresis or shortness of breath. Symptoms have improved since onset but she is still having some mild pressure. She received nitroglycerin and aspirin prehospital. She did not notice any appreciable difference in her pain after taking the nitroglycerin. No known history of CAD. Patient reports past stress testing approximately 4 years ago before shoulder surgery.   Past Medical History  Diagnosis Date  . Diabetes mellitus without complication   . Obesity   . Hypertension   . Hypothyroidism   . Vitamin D deficiency   . OSA (obstructive sleep apnea)   . Lymphedema   . Seizures   . Trigeminal neuralgia   . Arthritis   . Depression   . Anemia   . SOB (shortness of breath)   . Reflux   . Hypercholesteremia    Past Surgical History  Procedure Laterality Date  . Cholecystectomy    . Shoulder surgery    . Carpal tunnel release    . Ovarian cyst removal     Family History  Problem Relation Age of Onset  . Cancer Mother   . Hypertension Mother   . Diabetes Father   . Heart disease Father   . Heart attack Father   . Diabetes Sister   . Diabetes Brother    Social History  Substance Use Topics  . Smoking status: Never Smoker   . Smokeless tobacco: None  . Alcohol Use: No   OB History    No data available     Review of Systems  All systems reviewed and negative, other than as noted in  HPI.   Allergies  Dilantin and Tegretol  Home Medications   Prior to Admission medications   Medication Sig Start Date End Date Taking? Authorizing Provider  atorvastatin (LIPITOR) 10 MG tablet Take 10 mg by mouth daily.   Yes Historical Provider, MD  diclofenac sodium (VOLTAREN) 1 % GEL Apply 2 g topically as needed (for pain).    Yes Historical Provider, MD  escitalopram (LEXAPRO) 20 MG tablet Take 20 mg by mouth daily.   Yes Historical Provider, MD  gabapentin (NEURONTIN) 400 MG capsule Take 400 mg by mouth 2 (two) times daily.   Yes Historical Provider, MD  levothyroxine (SYNTHROID, LEVOTHROID) 200 MCG tablet Take 200 mcg by mouth daily before breakfast. **brand name only**   Yes Historical Provider, MD  lisinopril (PRINIVIL,ZESTRIL) 20 MG tablet Take 20 mg by mouth daily.   Yes Historical Provider, MD  metFORMIN (GLUMETZA) 500 MG (MOD) 24 hr tablet Take 500 mg by mouth 2 (two) times daily with a meal.   Yes Historical Provider, MD  Multiple Vitamin (MULTIVITAMIN) tablet Take 1 tablet by mouth daily.   Yes Historical Provider, MD  oxybutynin (DITROPAN XL) 15 MG 24 hr tablet Take 15 mg by mouth at bedtime.   Yes Historical Provider, MD  traMADol (ULTRAM) 50 MG tablet Take 50 mg by mouth every 6 (six) hours as needed (for pain).    Yes  Historical Provider, MD  valACYclovir (VALTREX) 500 MG tablet Take 500 mg by mouth daily. 07/28/15  Yes Historical Provider, MD   BP 98/52 mmHg  Pulse 50  Temp(Src) 98.1 F (36.7 C) (Oral)  Resp 15  Ht 5\' 9"  (1.753 m)  Wt 340 lb (154.223 kg)  BMI 50.19 kg/m2  SpO2 96% Physical Exam  Constitutional: She appears well-developed and well-nourished. No distress.  HENT:  Head: Normocephalic and atraumatic.  Eyes: Conjunctivae are normal. Right eye exhibits no discharge. Left eye exhibits no discharge.  Neck: Neck supple.  Cardiovascular: Normal rate, regular rhythm and normal heart sounds.  Exam reveals no gallop and no friction rub.   No murmur  heard. Pulmonary/Chest: Effort normal and breath sounds normal. No respiratory distress.  Abdominal: Soft. She exhibits no distension. There is no tenderness.  Musculoskeletal: She exhibits no edema or tenderness.  Lower extremities symmetric as compared to each other. No calf tenderness. Negative Homan's. No palpable cords.   Neurological: She is alert.  Skin: Skin is warm and dry.  Psychiatric: She has a normal mood and affect. Her behavior is normal. Thought content normal.  Nursing note and vitals reviewed.   ED Course  Procedures (including critical care time) Labs Review Labs Reviewed  CBC WITH DIFFERENTIAL/PLATELET - Abnormal; Notable for the following:    RBC 3.73 (*)    Hemoglobin 11.9 (*)    All other components within normal limits  BASIC METABOLIC PANEL - Abnormal; Notable for the following:    Glucose, Bld 104 (*)    BUN 28 (*)    Creatinine, Ser 1.16 (*)    GFR calc non Af Amer 47 (*)    GFR calc Af Amer 55 (*)    All other components within normal limits  TROPONIN I  TROPONIN I    Imaging Review Dg Chest 2 View  09/02/2015   CLINICAL DATA:  69 year old with current history of diabetes and hypertension, presenting with acute onset of chest pain and pressure.  EXAM: CHEST  2 VIEW  COMPARISON:  07/28/2011, 10/13/2010.  FINDINGS: Cardiac silhouette mildly enlarged with interval increase in heart size since 2012. Thoracic aorta tortuous and mildly atherosclerotic, with increased tortuosity since 2012, without evidence of aneurysm. Hilar and mediastinal contours otherwise unremarkable. Mild pulmonary venous hypertension without overt edema. Suboptimal inspiration accounts for mild atelectasis in the lower lobes. Lungs otherwise clear. No localized airspace consolidation. No pleural effusions. No pneumothorax. Normal pulmonary vascularity. Bilateral shoulder arthroplasties with anatomic alignment.  IMPRESSION: 1. Mild cardiomegaly, with interval increase in heart size since  2012. Mild pulmonary venous hypertension without overt pulmonary edema. 2. Suboptimal inspiration accounts for atelectasis in the lower lobes. No acute cardiopulmonary disease otherwise.   Electronically Signed   By: Hulan Saas M.D.   On: 09/02/2015 14:15   I have personally reviewed and evaluated these images and lab results as part of my medical decision-making.   EKG Interpretation   Date/Time:  Tuesday September 02 2015 12:42:54 EDT Ventricular Rate:  62 PR Interval:  164 QRS Duration: 106 QT Interval:  454 QTC Calculation: 461 R Axis:   18 Text Interpretation:  Sinus rhythm Low voltage, precordial leads  Non-specific ST-t changes Confirmed by Juleen China  MD, Laquanta Hummel ([id number]) on  09/02/2015 3:24:22 PM      MDM   Final diagnoses:  Chest pain, unspecified chest pain type    69yf with CP. Doubt ACS, PE, dissection, serious infection or other emergent process. It has been determined that no  acute conditions requiring further emergency intervention are present at this time. The patient has been advised of the diagnosis and plan. I reviewed any labs and imaging including any potential incidental findings. We have discussed signs and symptoms that warrant return to the ED and they are listed in the discharge instructions.      Raeford Razor, MD 09/11/15 [phone]

## 2015-09-02 NOTE — ED Notes (Signed)
Pt had a sudden onset of left sided chest pressure while lifting 3lb weights during and exercise program. EMS gave 2 nitro tablets and 324 mg ASA. Pt states chest pressure is now "Just coming and going" pt denies any SOB, N/V. Pt denies pain at this time.

## 2015-09-02 NOTE — ED Notes (Signed)
Pt given graham crackers and peanut butter per MD

## 2015-09-16 DIAGNOSIS — S335XXA Sprain of ligaments of lumbar spine, initial encounter: Secondary | ICD-10-CM | POA: Diagnosis not present

## 2015-10-03 ENCOUNTER — Encounter (HOSPITAL_COMMUNITY): Payer: Self-pay | Admitting: Emergency Medicine

## 2015-10-03 ENCOUNTER — Emergency Department (HOSPITAL_COMMUNITY): Payer: Medicare Other

## 2015-10-03 ENCOUNTER — Emergency Department (HOSPITAL_COMMUNITY)
Admission: EM | Admit: 2015-10-03 | Discharge: 2015-10-03 | Disposition: A | Discharge status: Home / Self Care | Payer: Medicare Other | Attending: Emergency Medicine | Admitting: Emergency Medicine

## 2015-10-03 DIAGNOSIS — I10 Essential (primary) hypertension: Secondary | ICD-10-CM | POA: Diagnosis not present

## 2015-10-03 DIAGNOSIS — Z79899 Other long term (current) drug therapy: Secondary | ICD-10-CM | POA: Diagnosis not present

## 2015-10-03 DIAGNOSIS — Z91040 Latex allergy status: Secondary | ICD-10-CM | POA: Diagnosis not present

## 2015-10-03 DIAGNOSIS — M545 Low back pain: Secondary | ICD-10-CM | POA: Diagnosis not present

## 2015-10-03 DIAGNOSIS — F329 Major depressive disorder, single episode, unspecified: Secondary | ICD-10-CM | POA: Diagnosis not present

## 2015-10-03 DIAGNOSIS — M5430 Sciatica, unspecified side: Secondary | ICD-10-CM | POA: Diagnosis not present

## 2015-10-03 DIAGNOSIS — Z862 Personal history of diseases of the blood and blood-forming organs and certain disorders involving the immune mechanism: Secondary | ICD-10-CM | POA: Diagnosis not present

## 2015-10-03 DIAGNOSIS — E039 Hypothyroidism, unspecified: Secondary | ICD-10-CM | POA: Diagnosis not present

## 2015-10-03 DIAGNOSIS — M1990 Unspecified osteoarthritis, unspecified site: Secondary | ICD-10-CM | POA: Diagnosis not present

## 2015-10-03 DIAGNOSIS — E669 Obesity, unspecified: Secondary | ICD-10-CM | POA: Diagnosis not present

## 2015-10-03 DIAGNOSIS — E119 Type 2 diabetes mellitus without complications: Secondary | ICD-10-CM | POA: Diagnosis not present

## 2015-10-03 DIAGNOSIS — M5441 Lumbago with sciatica, right side: Secondary | ICD-10-CM | POA: Diagnosis not present

## 2015-10-03 DIAGNOSIS — M549 Dorsalgia, unspecified: Secondary | ICD-10-CM

## 2015-10-03 DIAGNOSIS — M79604 Pain in right leg: Secondary | ICD-10-CM | POA: Diagnosis present

## 2015-10-03 MED ORDER — OXYCODONE HCL 5 MG PO TABS
5.0000 mg | ORAL_TABLET | Freq: Once | ORAL | Status: AC
  Administered 2015-10-03: 5 mg via ORAL
  Filled 2015-10-03: qty 1

## 2015-10-03 NOTE — ED Provider Notes (Signed)
CSN: [id number]     Arrival date & time 10/03/15  4098 History   First MD Initiated Contact with Patient 10/03/15 1050     Chief Complaint  Patient presents with  . Leg Pain  . Sciatica  . Drug Overdose     (Consider location/radiation/quality/duration/timing/severity/associated sxs/prior Treatment) Patient is a 69 y.o. female presenting with back pain. The history is provided by the patient.  Back Pain Location:  Lumbar spine Quality:  Aching Radiates to:  R posterior upper leg Pain severity:  Moderate Pain is:  Same all the time Onset quality:  Gradual Duration:  3 weeks Timing:  Constant Progression:  Unchanged Chronicity: 3 weeks. Context: not falling and not occupational injury   Relieved by:  Nothing Ineffective treatments:  Narcotics (taking oxycodone, tramadol, prednisone) Associated symptoms: no bladder incontinence, no bowel incontinence, no numbness, no perianal numbness and no weakness     Past Medical History  Diagnosis Date  . Diabetes mellitus without complication (HCC)   . Obesity   . Hypertension   . Hypothyroidism   . Vitamin D deficiency   . OSA (obstructive sleep apnea)   . Lymphedema   . Seizures (HCC)   . Trigeminal neuralgia   . Arthritis   . Depression   . Anemia   . SOB (shortness of breath)   . Reflux   . Hypercholesteremia    Past Surgical History  Procedure Laterality Date  . Cholecystectomy    . Shoulder surgery    . Carpal tunnel release    . Ovarian cyst removal     Family History  Problem Relation Age of Onset  . Cancer Mother   . Hypertension Mother   . Diabetes Father   . Heart disease Father   . Heart attack Father   . Diabetes Sister   . Diabetes Brother    Social History  Substance Use Topics  . Smoking status: Never Smoker   . Smokeless tobacco: None  . Alcohol Use: No   OB History    No data available     Review of Systems  Gastrointestinal: Negative for bowel incontinence.  Genitourinary: Negative  for bladder incontinence.  Musculoskeletal: Positive for back pain.  Neurological: Negative for weakness and numbness.  All other systems reviewed and are negative.     Allergies  Dilantin; Tegretol; and Latex  Home Medications   Prior to Admission medications   Medication Sig Start Date End Date Taking? Authorizing Provider  atorvastatin (LIPITOR) 10 MG tablet Take 10 mg by mouth daily at 6 PM.    Yes Historical Provider, MD  cholecalciferol (VITAMIN D) 1000 UNITS tablet Take 1,000 Units by mouth daily.   Yes Historical Provider, MD  diclofenac sodium (VOLTAREN) 1 % GEL Apply 2 g topically as needed (for pain).    Yes Historical Provider, MD  escitalopram (LEXAPRO) 20 MG tablet Take 20 mg by mouth at bedtime.    Yes Historical Provider, MD  gabapentin (NEURONTIN) 400 MG capsule Take 400 mg by mouth 2 (two) times daily.   Yes Historical Provider, MD  levothyroxine (SYNTHROID, LEVOTHROID) 200 MCG tablet Take 200 mcg by mouth daily before breakfast. **brand name only**   Yes Historical Provider, MD  lisinopril (PRINIVIL,ZESTRIL) 20 MG tablet Take 20 mg by mouth daily.   Yes Historical Provider, MD  metFORMIN (GLUMETZA) 500 MG (MOD) 24 hr tablet Take 500 mg by mouth 2 (two) times daily with a meal.   Yes Historical Provider, MD  Multiple Vitamin (MULTIVITAMIN)  tablet Take 1 tablet by mouth daily.   Yes Historical Provider, MD  oxybutynin (DITROPAN XL) 15 MG 24 hr tablet Take 15 mg by mouth every morning.    Yes Historical Provider, MD  oxyCODONE-acetaminophen (PERCOCET/ROXICET) 5-325 MG tablet Take 1 tablet by mouth every 6 (six) hours as needed. 09/16/15  Yes Historical Provider, MD  traMADol (ULTRAM) 50 MG tablet Take 50 mg by mouth every 6 (six) hours as needed (for pain).    Yes Historical Provider, MD  valACYclovir (VALTREX) 500 MG tablet Take 500 mg by mouth daily. 07/28/15  Yes Historical Provider, MD   BP 107/58 mmHg  Pulse 94  Temp(Src) 97.9 F (36.6 C) (Oral)  Resp 20  Ht   (1.778 m)  Wt 336 lb (152.409 kg)  BMI 48.21 kg/m2  SpO2 96% Physical Exam  Constitutional: She is oriented to person, place, and time. She appears well-developed and well-nourished. No distress.  HENT:  Head: Normocephalic.  Eyes: Conjunctivae are normal.  Neck: Neck supple. No tracheal deviation present.  Cardiovascular: Normal rate and regular rhythm.   Pulmonary/Chest: Effort normal. No respiratory distress.  Abdominal: Soft. She exhibits no distension.  Musculoskeletal:       Lumbar back: She exhibits pain (right low back tenderness at level of iliac crest).  Neurological: She is alert and oriented to person, place, and time.  Skin: Skin is warm and dry.  Psychiatric: She has a normal mood and affect.    ED Course  Procedures (including critical care time) Labs Review Labs Reviewed - No data to display  Imaging Review Dg Lumbar Spine 2-3 Views  10/03/2015  CLINICAL DATA:  Persistent low back and sciatica for 3 weeks. EXAM: LUMBAR SPINE - 2-3 VIEW COMPARISON:  10/03/2015, 09/02/2015 FINDINGS: Mild scoliosis of the lower thoracic and lumbar spine. 5 lumbar type vertebral bodies. Preserved vertebral body heights. Minor diffuse disc space narrowing, most pronounced at L5-S1. Mild L5-S1 facet arthropathy. No acute compression fracture, wedge-shaped deformity or focal kyphosis. Pedicles appear intact. Normal SI joints. Nonobstructive bowel gas pattern. IMPRESSION: Mild diffuse lower thoracic and lumbar spondylosis and degenerative changes. Mild associated scoliosis. No acute osseous finding by plain radiography. Electronically Signed   By: Judie Petit.  Shick M.D.   On: 10/03/2015 12:22   Dg Pelvis 1-2 Views  10/03/2015  CLINICAL DATA:  69 year old female with sciatica pain for the past 3 weeks, treated with steroids. EXAM: PELVIS - 1-2 VIEW COMPARISON:  No priors. FINDINGS: Single AP view of the pelvis demonstrates no acute displaced fracture of the bony pelvic ring. Bilateral proximal femurs  as visualized appear intact, and the femoral heads appear located on this single view examination. There is an unusual lucency in the right pubic bone extending from the region of the symphysis pubis into the right superior pubic ramus, which appears very well-defined and has somewhat sclerotic margins. IMPRESSION: 1. No acute radiographic abnormality of the bony pelvis. 2. Indeterminate but generally benign appearing lucent lesion in the right pubic bone, as above. While it is possible that this is artifactual, this does appear to conform to the bone. The presence or absence of a bony lesion could be confirmed with dedicated right hip radiographs. Electronically Signed   By: Trudie Reed M.D.   On: 10/03/2015 12:28   Dg Hip Unilat With Pelvis 2-3 Views Right  10/03/2015  CLINICAL DATA:  Right posterior lower back pain.  No known injury. EXAM: DG HIP (WITH OR WITHOUT PELVIS) 2-3V RIGHT COMPARISON:  None. FINDINGS:  Slight joint space narrowing in the hips bilaterally. No acute bony abnormality. Specifically, no fracture, subluxation, or dislocation. Soft tissues are intact. IMPRESSION: No acute bony abnormality. Electronically Signed   By: Charlett NoseKevin  Dover M.D.   On: 10/03/2015 14:00   I have personally reviewed and evaluated these images and lab results as part of my medical decision-making.   EKG Interpretation None      MDM   Final diagnoses:  None    69 year old female presents with ongoing 3 weeks of right low back pain and right sided sciatica symptoms. No red flag symptoms of fever, weight loss, saddle anesthesia, weakness fecal/urinary incontinence or urinary retention. Has been seen in orthopedic surgery office and was unable to tolerate laying on the table so she was told to come to the emergency department for plain films of her back due to availability of equipment that she can tolerate. She has no acute findings on exam and has a negative right straight leg raise. No indication for  more advanced imaging currently. Patient also admits to taking 6 pills of tramadol last night and an oxycodone this morning. She admits to overdosing due to pain and was not intentionally trying to harm herself.  Radiology reports possible lucency over the pelvis, right hip film was ordered and I explained to patient that we were evaluating to rule out a potential lesion. After hip films returned negative I spoke with the patient again and she was upset regarding her previous interaction. I apologized to her and reassured her that she should continue her outpatient management and pursue physical therapy and continued weight loss. She requested narcotic medications for home but appears she has had multiple prescriptions and is at a high potential for abuse given her recent overdose so I requested she follow up with her primary care provider for further prescription management.  Lyndal Pulleyaniel Chace Bisch, MD 10/04/15 [phone]

## 2015-10-03 NOTE — ED Notes (Signed)
Pt c/o sciatica pain x 3 weeks. Pt seen at ortho dr and given prescription for steroids. Pt also reports that she took too much tramadol, she took 6-8 throughout the night. Pt also took 1 oxycodone at 0800.

## 2015-10-03 NOTE — Discharge Instructions (Signed)
Sciatica °Sciatica is pain, weakness, numbness, or tingling along the path of the sciatic nerve. The nerve starts in the lower back and runs down the back of each leg. The nerve controls the muscles in the lower leg and in the back of the knee, while also providing sensation to the back of the thigh, lower leg, and the sole of your foot. Sciatica is a symptom of another medical condition. For instance, nerve damage or certain conditions, such as a herniated disk or bone spur on the spine, pinch or put pressure on the sciatic nerve. This causes the pain, weakness, or other sensations normally associated with sciatica. Generally, sciatica only affects one side of the body. °CAUSES  °· Herniated or slipped disc. °· Degenerative disk disease. °· A pain disorder involving the narrow muscle in the buttocks (piriformis syndrome). °· Pelvic injury or fracture. °· Pregnancy. °· Tumor (rare). °SYMPTOMS  °Symptoms can vary from mild to very severe. The symptoms usually travel from the low back to the buttocks and down the back of the leg. Symptoms can include: °· Mild tingling or dull aches in the lower back, leg, or hip. °· Numbness in the back of the calf or sole of the foot. °· Burning sensations in the lower back, leg, or hip. °· Sharp pains in the lower back, leg, or hip. °· Leg weakness. °· Severe back pain inhibiting movement. °These symptoms may get worse with coughing, sneezing, laughing, or prolonged sitting or standing. Also, being overweight may worsen symptoms. °DIAGNOSIS  °Your caregiver will perform a physical exam to look for common symptoms of sciatica. He or she may ask you to do certain movements or activities that would trigger sciatic nerve pain. Other tests may be performed to find the cause of the sciatica. These may include: °· Blood tests. °· X-rays. °· Imaging tests, such as an MRI or CT scan. °TREATMENT  °Treatment is directed at the cause of the sciatic pain. Sometimes, treatment is not necessary  and the pain and discomfort goes away on its own. If treatment is needed, your caregiver may suggest: °· Over-the-counter medicines to relieve pain. °· Prescription medicines, such as anti-inflammatory medicine, muscle relaxants, or narcotics. °· Applying heat or ice to the painful area. °· Steroid injections to lessen pain, irritation, and inflammation around the nerve. °· Reducing activity during periods of pain. °· Exercising and stretching to strengthen your abdomen and improve flexibility of your spine. Your caregiver may suggest losing weight if the extra weight makes the back pain worse. °· Physical therapy. °· Surgery to eliminate what is pressing or pinching the nerve, such as a bone spur or part of a herniated disk. °HOME CARE INSTRUCTIONS  °· Only take over-the-counter or prescription medicines for pain or discomfort as directed by your caregiver. °· Apply ice to the affected area for 20 minutes, 3-4 times a day for the first 48-72 hours. Then try heat in the same way. °· Exercise, stretch, or perform your usual activities if these do not aggravate your pain. °· Attend physical therapy sessions as directed by your caregiver. °· Keep all follow-up appointments as directed by your caregiver. °· Do not wear high heels or shoes that do not provide proper support. °· Check your mattress to see if it is too soft. A firm mattress may lessen your pain and discomfort. °SEEK IMMEDIATE MEDICAL CARE IF:  °· You lose control of your bowel or bladder (incontinence). °· You have increasing weakness in the lower back, pelvis, buttocks,   or legs. °· You have redness or swelling of your back. °· You have a burning sensation when you urinate. °· You have pain that gets worse when you lie down or awakens you at night. °· Your pain is worse than you have experienced in the past. °· Your pain is lasting longer than 4 weeks. °· You are suddenly losing weight without reason. °MAKE SURE YOU: °· Understand these  instructions. °· Will watch your condition. °· Will get help right away if you are not doing well or get worse. °  °This information is not intended to replace advice given to you by your health care provider. Make sure you discuss any questions you have with your health care provider. °  °Document Released: 11/30/2001 Document Revised: 08/27/2015 Document Reviewed: 04/16/2012 °Elsevier Interactive Patient Education ©2016 [employer] °Chronic Back Pain ° When back pain lasts longer than 3 months, it is called chronic back pain. People with chronic back pain often go through certain periods that are more intense (flare-ups).  °CAUSES °Chronic back pain can be caused by wear and tear (degeneration) on different structures in your back. These structures include: °· The bones of your spine (vertebrae) and the joints surrounding your spinal cord and nerve roots (facets). °· The strong, fibrous tissues that connect your vertebrae (ligaments). °Degeneration of these structures may result in pressure on your nerves. This can lead to constant pain. °HOME CARE INSTRUCTIONS °· Avoid bending, heavy lifting, prolonged sitting, and activities which make the problem worse. °· Take brief periods of rest throughout the day to reduce your pain. Lying down or standing usually is better than sitting while you are resting. °· Take over-the-counter or prescription medicines only as directed by your caregiver. °SEEK IMMEDIATE MEDICAL CARE IF:  °· You have weakness or numbness in one of your legs or feet. °· You have trouble controlling your bladder or bowels. °· You have nausea, vomiting, abdominal pain, shortness of breath, or fainting. °  °This information is not intended to replace advice given to you by your health care provider. Make sure you discuss any questions you have with your health care provider. °  °Document Released: 01/13/2005 Document Revised: 02/28/2012 Document Reviewed: 05/26/2015 °Elsevier Interactive Patient  Education ©2016 Elsevier Inc. ° ° ° °

## 2015-10-06 ENCOUNTER — Other Ambulatory Visit: Payer: Self-pay | Admitting: Sports Medicine

## 2015-10-06 DIAGNOSIS — M545 Low back pain: Secondary | ICD-10-CM | POA: Diagnosis not present

## 2015-10-06 DIAGNOSIS — M5440 Lumbago with sciatica, unspecified side: Secondary | ICD-10-CM

## 2015-10-14 ENCOUNTER — Ambulatory Visit
Admission: RE | Admit: 2015-10-14 | Discharge: 2015-10-14 | Disposition: A | Discharge status: Home / Self Care | Payer: Medicare Other | Source: Ambulatory Visit | Attending: Sports Medicine | Admitting: Sports Medicine

## 2015-10-14 DIAGNOSIS — M5116 Intervertebral disc disorders with radiculopathy, lumbar region: Secondary | ICD-10-CM | POA: Diagnosis not present

## 2015-10-14 DIAGNOSIS — M5440 Lumbago with sciatica, unspecified side: Secondary | ICD-10-CM

## 2015-10-14 MED ORDER — GADOBENATE DIMEGLUMINE 529 MG/ML IV SOLN: 12.0000 mL | Freq: Once | INTRAVENOUS | Status: DC | PRN

## 2015-10-16 DIAGNOSIS — M5106 Intervertebral disc disorders with myelopathy, lumbar region: Secondary | ICD-10-CM | POA: Diagnosis not present

## 2015-10-21 ENCOUNTER — Other Ambulatory Visit: Payer: Self-pay

## 2015-10-22 ENCOUNTER — Other Ambulatory Visit: Payer: Self-pay

## 2015-11-05 DIAGNOSIS — Z713 Dietary counseling and surveillance: Secondary | ICD-10-CM | POA: Diagnosis not present

## 2015-11-25 DIAGNOSIS — R635 Abnormal weight gain: Secondary | ICD-10-CM | POA: Diagnosis not present

## 2015-11-25 DIAGNOSIS — I10 Essential (primary) hypertension: Secondary | ICD-10-CM | POA: Diagnosis not present

## 2015-11-25 DIAGNOSIS — M5106 Intervertebral disc disorders with myelopathy, lumbar region: Secondary | ICD-10-CM | POA: Diagnosis not present

## 2015-11-25 DIAGNOSIS — E119 Type 2 diabetes mellitus without complications: Secondary | ICD-10-CM | POA: Diagnosis not present

## 2015-11-25 DIAGNOSIS — M170 Bilateral primary osteoarthritis of knee: Secondary | ICD-10-CM | POA: Diagnosis not present

## 2015-11-26 DIAGNOSIS — M545 Low back pain: Secondary | ICD-10-CM | POA: Diagnosis not present

## 2015-11-26 DIAGNOSIS — M5106 Intervertebral disc disorders with myelopathy, lumbar region: Secondary | ICD-10-CM | POA: Diagnosis not present

## 2015-12-28 NOTE — Progress Notes (Signed)
Patient ID: Kathy Young, female   DOB: [birth year], 70 y.o.   MRN: [id number]     Cardiology Office Note   Date:  12/28/2015   ID:  Kathy Young, DOB 1946/07/05, MRN [id number]  PCP:  Cain Saupe, MD  Cardiologist:   Donato Schultz  No chief complaint on file.     History of Present Illness: Kathy Young is a 70 y.o. female patient of Dr Anne Fu.  Added on to my schedule to expedite clearance for surgery.   Seen in ER 9/16 atypical r/o and discharged.  CRF;s HTN, elevated lipids and Type 2 DM.  Seen in ER 10/16 with left sided sciatica and ? Narcotic abuse potential.  Seen by Dr Anne Fu 2012 and cleared for surgery .  Reviewed echo and lexiscan myovue done than and both normal. She has had two shoulder replacements with Dr Dion Saucier under general anesthesia with no cardiac complications.  She does have OSA but refuses to wear CPAP as it caused trigeminal neuralgia after 4 days  She was diagnosed with DM 3 years ago and it is better controlled.  Sees [facility] weight loss program and has lost about 100lbs over the last 2 years.  Has chronic over 10 years left knee pain that has failed conservative Rx and needs TKR.    No chest pain with activity Ambulates with cane. No palpitations, history of DVT or syncope. Compliant with meds Will be going to rehab post op and brother from [place] will check on her     Past Medical History  Diagnosis Date  . Diabetes mellitus without complication (HCC)   . Obesity   . Hypertension   . Hypothyroidism   . Vitamin D deficiency   . OSA (obstructive sleep apnea)   . Lymphedema   . Seizures (HCC)   . Trigeminal neuralgia   . Arthritis   . Depression   . Anemia   . SOB (shortness of breath)   . Reflux   . Hypercholesteremia     Past Surgical History  Procedure Laterality Date  . Cholecystectomy    . Shoulder surgery    . Carpal tunnel release    . Ovarian cyst removal       Current Outpatient Prescriptions  Medication Sig Dispense  Refill  . atorvastatin (LIPITOR) 10 MG tablet Take 10 mg by mouth daily at 6 PM.     . cholecalciferol (VITAMIN D) 1000 UNITS tablet Take 1,000 Units by mouth daily.    . diclofenac sodium (VOLTAREN) 1 % GEL Apply 2 g topically as needed (for pain).     Marland Kitchen escitalopram (LEXAPRO) 20 MG tablet Take 20 mg by mouth at bedtime.     . gabapentin (NEURONTIN) 400 MG capsule Take 400 mg by mouth 2 (two) times daily.    Marland Kitchen levothyroxine (SYNTHROID, LEVOTHROID) 200 MCG tablet Take 200 mcg by mouth daily before breakfast. **brand name only**    . lisinopril (PRINIVIL,ZESTRIL) 20 MG tablet Take 20 mg by mouth daily.    . metFORMIN (GLUMETZA) 500 MG (MOD) 24 hr tablet Take 500 mg by mouth 2 (two) times daily with a meal.    . Multiple Vitamin (MULTIVITAMIN) tablet Take 1 tablet by mouth daily.    Marland Kitchen oxybutynin (DITROPAN XL) 15 MG 24 hr tablet Take 15 mg by mouth every morning.     Marland Kitchen oxyCODONE-acetaminophen (PERCOCET/ROXICET) 5-325 MG tablet Take 1 tablet by mouth every 6 (six) hours as needed.  0  . traMADol (ULTRAM)  50 MG tablet Take 50 mg by mouth every 6 (six) hours as needed (for pain).     . valACYclovir (VALTREX) 500 MG tablet Take 500 mg by mouth daily.  5   No current facility-administered medications for this visit.    Allergies:   Dilantin; Tegretol; and Latex    Social History:  The patient  reports that she has never smoked. She does not have any smokeless tobacco history on file. She reports that she does not drink alcohol or use illicit drugs.   Family History:  The patient's family history includes Cancer in her mother; Diabetes in her brother, father, and sister; Heart attack in her father; Heart disease in her father; Hypertension in her mother.    ROS:  Please see the history of present illness.   Otherwise, review of systems are positive for none.   All other systems are reviewed and negative.    PHYSICAL EXAM: VS:  There were no vitals taken for this visit. , BMI There is no weight  on file to calculate BMI. Affect appropriate Obese white female  HEENT: normal Neck supple with no adenopathy JVP normal no bruits no thyromegaly Lungs clear with no wheezing and good diaphragmatic motion Heart:  S1/S2 1/6 SEM  murmur, no rub, gallop or click PMI normal Abdomen: benighn, BS positve, no tenderness, no AAA no bruit.  No HSM or HJR Distal pulses intact with no bruits Plus one bilateral edema Neuro non-focal Skin warm and dry Bilateral shoulder replacements Left knee osteoarthritis    EKG:   09/03/15 SR rate 68 normal    Recent Labs: 09/02/2015: BUN 28*; Creatinine, Ser 1.16*; Hemoglobin 11.9*; Platelets 175; Potassium 4.3; Sodium 141    Lipid Panel No results found for: CHOL, TRIG, HDL, CHOLHDL, VLDL, LDLCALC, LDLDIRECT    Wt Readings from Last 3 Encounters:  10/03/15 152.409 kg (336 lb)  09/02/15 154.223 kg (340 lb)  08/18/15 151.048 kg (333 lb)      Other studies Reviewed: Additional studies/ records that were reviewed today [facility] Fu[facility]ains notes Nuclear/echo studies ER visit September 2016 Epic ECG;s .    ASSESSMENT AND PLAN:  1.  Preop:  No symptoms, normal exam and ECG  Normal myovue 2012  Clear to have left TKR with Dr Dion SaucierLandau with no further testing Will need good DVT prophylaxis   2. DM Discussed low carb diet.  Target hemoglobin A1c is 6.5 or less.  Continue current medications. 3. Obesity:  Improved continue f/u [facility]  4. OSA:  Did appear to have some hypoventilation and desats after anesthesia for one of her shoulder surgeries but was 100 lb heavier then Airway management per anesthesia.  She will not wear CPAP 5. Cholesterol is at goal.  Continue current dose of statin and diet Rx.  No myalgias or side effects.  F/U  LFT's in 6 months. No results found for: [facility]             Current medicines are reviewed at length with the patient today.  The patient does not have concerns regarding medicines.  The  following changes have been made:  no change  Labs/ tests ordered today include: None  No orders of the defined types were placed in this encounter.     Disposition:   FU with Dr Anne FuSkains as needed     Signed, Charlton HawsPeter Cleland Simkins, MD  12/28/2015 10:10 PM    [facility] [address], [place], [place]  [address] Phone: [phone]; Fax: [phone]

## 2015-12-30 ENCOUNTER — Encounter: Payer: Self-pay | Admitting: Cardiovascular Disease

## 2015-12-30 ENCOUNTER — Ambulatory Visit (INDEPENDENT_AMBULATORY_CARE_PROVIDER_SITE_OTHER): Payer: Medicare Other | Admitting: Cardiovascular Disease

## 2015-12-30 VITALS — BP 100/60 | HR 96 | Ht 70.0 in | Wt 311.1 lb

## 2015-12-30 DIAGNOSIS — Z01810 Encounter for preprocedural cardiovascular examination: Secondary | ICD-10-CM | POA: Diagnosis not present

## 2015-12-30 NOTE — Patient Instructions (Addendum)
Medication Instructions:  Your physician recommends that you continue on your current medications as directed. Please refer to the Current Medication list given to you today.  Labwork: NONE  Testing/Procedures: NONE  Follow-Up: Your physician wants you to follow-up as needed.  If you need a refill on your cardiac medications before your next appointment, please call your pharmacy.   

## 2016-01-07 ENCOUNTER — Other Ambulatory Visit: Payer: Self-pay | Admitting: Orthopedic Surgery

## 2016-02-12 ENCOUNTER — Encounter (HOSPITAL_COMMUNITY)
Admission: RE | Admit: 2016-02-12 | Discharge: 2016-02-12 | Disposition: A | Discharge status: Home / Self Care | Payer: Medicare Other | Source: Ambulatory Visit | Attending: Orthopedic Surgery | Admitting: Orthopedic Surgery

## 2016-02-12 ENCOUNTER — Encounter (HOSPITAL_COMMUNITY): Payer: Self-pay

## 2016-02-12 DIAGNOSIS — F419 Anxiety disorder, unspecified: Secondary | ICD-10-CM | POA: Diagnosis not present

## 2016-02-12 DIAGNOSIS — M1711 Unilateral primary osteoarthritis, right knee: Secondary | ICD-10-CM | POA: Diagnosis not present

## 2016-02-12 DIAGNOSIS — G4733 Obstructive sleep apnea (adult) (pediatric): Secondary | ICD-10-CM | POA: Diagnosis not present

## 2016-02-12 DIAGNOSIS — Z01818 Encounter for other preprocedural examination: Secondary | ICD-10-CM | POA: Diagnosis not present

## 2016-02-12 DIAGNOSIS — F329 Major depressive disorder, single episode, unspecified: Secondary | ICD-10-CM | POA: Diagnosis not present

## 2016-02-12 DIAGNOSIS — E119 Type 2 diabetes mellitus without complications: Secondary | ICD-10-CM | POA: Diagnosis not present

## 2016-02-12 DIAGNOSIS — E7800 Pure hypercholesterolemia, unspecified: Secondary | ICD-10-CM | POA: Diagnosis not present

## 2016-02-12 DIAGNOSIS — K219 Gastro-esophageal reflux disease without esophagitis: Secondary | ICD-10-CM | POA: Diagnosis not present

## 2016-02-12 DIAGNOSIS — Z79899 Other long term (current) drug therapy: Secondary | ICD-10-CM | POA: Diagnosis not present

## 2016-02-12 DIAGNOSIS — I10 Essential (primary) hypertension: Secondary | ICD-10-CM | POA: Diagnosis not present

## 2016-02-12 DIAGNOSIS — Z01812 Encounter for preprocedural laboratory examination: Secondary | ICD-10-CM | POA: Diagnosis not present

## 2016-02-12 DIAGNOSIS — E039 Hypothyroidism, unspecified: Secondary | ICD-10-CM | POA: Diagnosis not present

## 2016-02-12 DIAGNOSIS — Z7984 Long term (current) use of oral hypoglycemic drugs: Secondary | ICD-10-CM | POA: Diagnosis not present

## 2016-02-12 HISTORY — DX: Pneumonia, unspecified organism: J18.9

## 2016-02-12 HISTORY — DX: Patient's other noncompliance with medication regimen: Z91.14

## 2016-02-12 HISTORY — DX: Anxiety disorder, unspecified: F41.9

## 2016-02-12 HISTORY — DX: Unspecified urinary incontinence: R32

## 2016-02-12 LAB — BASIC METABOLIC PANEL
ANION GAP: 9 (ref 5–15)
BUN: 33 mg/dL — AB (ref 6–20)
CHLORIDE: 106 mmol/L (ref 101–111)
CO2: 26 mmol/L (ref 22–32)
Calcium: 9.7 mg/dL (ref 8.9–10.3)
Creatinine, Ser: 1.11 mg/dL — ABNORMAL HIGH (ref 0.44–1.00)
GFR calc Af Amer: 58 mL/min — ABNORMAL LOW (ref >60)
GFR calc non Af Amer: 50 mL/min — ABNORMAL LOW (ref >60)
GLUCOSE: 112 mg/dL — AB (ref 65–99)
POTASSIUM: 4.6 mmol/L (ref 3.5–5.1)
Sodium: 141 mmol/L (ref 135–145)

## 2016-02-12 LAB — GLUCOSE, CAPILLARY: GLUCOSE-CAPILLARY: 100 mg/dL — AB (ref 65–99)

## 2016-02-12 LAB — CBC
HEMATOCRIT: 36 % (ref 36.0–46.0)
HEMOGLOBIN: 12.3 g/dL (ref 12.0–15.0)
MCH: 33.1 pg (ref 26.0–34.0)
MCHC: 34.2 g/dL (ref 30.0–36.0)
MCV: 96.8 fL (ref 78.0–100.0)
Platelets: 158 10*3/uL (ref 150–400)
RBC: 3.72 MIL/uL — ABNORMAL LOW (ref 3.87–5.11)
RDW: 12.4 % (ref 11.5–15.5)
WBC: 4.2 10*3/uL (ref 4.0–10.5)

## 2016-02-12 LAB — SURGICAL PCR SCREEN
MRSA, PCR: NEGATIVE
Staphylococcus aureus: POSITIVE — AB

## 2016-02-12 NOTE — Progress Notes (Addendum)
Patient has had cardiology work up prior to shoulder surgery in the past.  She had an echocardiogram and stress test done in June of 2012 with [facility].  She did have some recent chest pain in Sept 2016 but was discharged (referred to in Dr. Fabio Bering note).  She has a clearance from Dr. Eden Emms in [facility] under notes from 12/28/2015.   pcp is FULP, CAMMIE, MD

## 2016-02-12 NOTE — Pre-Procedure Instructions (Signed)
Kathy Young  02/12/2016      [id number] [place], [place] - [address] [place] [address] Phone: [phone] Fax: [phone]    Your procedure is scheduled on Tuesday March 7th.  Report to [facility] Admitting at 840 A.M.  Call this number if you have problems the morning of surgery:  [phone]   Remember:  Do not eat food or drink liquids after midnight Monday March 6th.  Take these medicines the morning of surgery with A SIP OF WATER gabapentin (neurontin) if needed, levothyroxine (synthroid/levothroid) oxybutynin (ditropan) if needed, tramadol (ultram) if needed, valacyclovir (if needed)  Do not take metformin (glucophage) the day of surgery.   STOP: ALL Vitamins, Supplements, Effient and Herbal Medications, Fish Oils, Aspirins, NSAIDs (Nonsteroidal Anti-inflammatories such as Ibuprofen, Aleve, or Advil), and Goody's/BC Powders Tuesday Feb 28, until after surgery as directed by your physician.   How to Manage Your Diabetes Before Surgery   Why is it important to control my blood sugar before and after surgery?   Improving blood sugar levels before and after surgery helps healing and can limit problems.  A way of improving blood sugar control is eating a healthy diet by:  - Eating less sugar and carbohydrates  - Increasing activity/exercise  - Talk with your doctor about reaching your blood sugar goals  High blood sugars (greater than 180 mg/dL) can raise your risk of infections and slow down your recovery so you will need to focus on controlling your diabetes during the weeks before surgery.  Make sure that the doctor who takes care of your diabetes knows about your planned surgery including the date and location.  How do I manage my blood sugars before surgery?   Check your blood sugar at least 4 times a day, 2 days before surgery to make sure that they are not too high or  low.   Check your blood sugar the morning of your surgery when you wake up and every 2               hours until you get to the Short-Stay unit.  If your blood sugar is less than 70 mg/dL, you will need to treat for low blood sugar by:  Treat a low blood sugar (less than 70 mg/dL) with 1/2 cup of clear juice (cranberry or apple), 4 glucose tablets, OR glucose gel.  Recheck blood sugar in 15 minutes after treatment (to make sure it is greater than 70 mg/dL).  If blood sugar is not greater than 70 mg/dL on re-check, call [phone] for further instructions.   Report your blood sugar to the Short-Stay nurse when you get to Short-Stay.  References:  [facility], 2007 "How to Manage your Diabetes Before and After Surgery".  What do I do about my diabetes medications?   Do not take oral diabetes medicines (pills) the morning of surgery.   Do not wear jewelry, make-up or nail polish.  Do not wear lotions, powders, or perfumes.  You may wear deodorant.  Do not shave 48 hours prior to surgery.  Men may shave face and neck.  Do not bring valuables to the hospital.  [facility] is not responsible for any belongings or valuables.  Contacts, dentures or bridgework may not be worn into surgery.  Leave your suitcase in the car.  After surgery it may be brought to your room.  For  patients admitted to the hospital, discharge time will be determined by your treatment team.  Patients discharged the day of surgery will not be allowed to drive home.        Preparing for Surgery at [facility]  Before surgery, you can play an important role.  Because skin is not sterile, your skin needs to be as free of germs as possible.  You can reduce the number of germs on your skin by washing with CHG (chlorahexidine gluconate) Soap before surgery.  CHG is an antiseptic cleaner with kills germs and bonds with the skin to continue killing germs even after washing.   Please do not use if  you have an allergy to CHG or antibacterial soaps.  If your skin becomes reddened/irritated stop using the CHG.  Do not shave (including legs and underarms) for at least 48 hours prior to first CHG shower.  It is okay to shave your face.  Please follow these instructions carefully:  1. Shower with CHG Soap the night before surgery and the morning of Surgery. 2. If you choose to wash your hair, wash your hair first as usual with your normal shampoo. 3. After you shampoo, rinse your hair and body thoroughly to remove the Shampoo. 4. Use CHG as you would any other liquid soap. You can apply chg directly to the skin and wash gently with scrungie or a clean washcloth. 5. Apply the CHG Soap to your body ONLY FROM THE NECK DOWN. Do not use on open wounds or open sores. Avoid contact with your eyes, ears, mouth and genitals (private parts). Wash genitals (private parts) with your normal soap. 6. Wash thoroughly, paying special attention to the area where your surgery will be performed. 7. Thoroughly rinse your body with warm water from the neck down. 8. DO NOT shower/wash with your normal soap after using and rinsing off the CHG Soap. 9. Pat yourself dry with a clean towel.  10. Wear clean pajamas.  11. Place clean sheets on your bed the night of your first shower and do not sleep with pets.  Day of Surgery  Do not apply any lotions/deodorants the morning of surgery. Please wear clean clothes to the hospital/surgery center.   Please read over the following fact sheets that you were given. Pain Booklet, Coughing and Deep Breathing, Total Joint Packet, MRSA Information and Surgical Site Infection Prevention

## 2016-02-12 NOTE — Progress Notes (Signed)
Contacted patient and spoke with them to inform her of positive Staph results from nasal swab.  Patient verbalized understanding to pick up mupirocin ointment and carry out treatment.

## 2016-02-13 LAB — HEMOGLOBIN A1C
Hgb A1c MFr Bld: 5.9 % — ABNORMAL HIGH (ref 4.8–5.6)
MEAN PLASMA GLUCOSE: 123 mg/dL

## 2016-02-13 NOTE — Progress Notes (Signed)
Anesthesiologist Chart Review: Patient is a 70 year old female scheduled for right TKA on 02/24/16 by Dr. Dion Saucier.  History includes non-smoker, DM2, HTN, hypothyroidism, OSA (CPAP non-compliance; caused symptoms of trigeminal neuralgia), anemia, depression, anxiety, seizures (> 20 years ago), reflux, hypercholesterolemia, cholecystectomy, appendectomy, tonsillectomy. BMI is consistent with morbid obesity. PCP is Dr. Cain Saupe. She is seen at the [facility] and has lost about 100 lbs over the past two years. Has been seen by cardiologist Dr. Anne Fu in the past (2012 for pre-op clearance), but most recently by Dr. Eden Emms for pre-operative evaluation on 12/30/15 since she had an ED visit 08/2015 for chest pain, ruled out for MI. He cleared patient to proceed without additional cardiac testing.   Meds includes Lipitor, Lexapro, Neurontin, levothyroxine, lisinopril, metformin, Ditropan XL, tramadol, Valtrex.   09/02/15 EKG: SR, low voltage, precordial leads, non-specific ST/T changes.  2012 Cardiac testing results (scanned under Media tab, Correspondence, Encounter date 08/03/11): - 06/17/11 Echo: Conclusions: 1. Mild concentric LVH. 2. Unable to adequately assess LV function due to poor acoustic window. 3. LVEF estimated at 62.3%. 4. The AV is sclerotic but opens well.  - 05/2011 Nuclear stress test: Study is limited by breast attenuation artifact, however the scan is overall low risk with no large areas of ischemia identified. Normal LVEF.   08/07/13 Carotid Duplex: IMPRESSION: No significant carotid bifurcation or proximal ICA plaque or stenosis.  09/02/15 CXR: IMPRESSION: 1. Mild cardiomegaly, with interval increase in heart size since 2012. Mild pulmonary venous hypertension without overt pulmonary edema. 2. Suboptimal inspiration accounts for atelectasis in the lower lobes. No acute cardiopulmonary disease otherwise.  Preoperative labs noted. A1c 5.9.  If no acute changes then I  anticipate that she can proceed as planned.  Velna [facility] Short Stay Center/Anesthesiology Phone [phone] 02/13/2016 11:02 AM

## 2016-02-23 MED ORDER — DEXTROSE 5 % IV SOLN
3.0000 g | INTRAVENOUS | Status: AC
  Administered 2016-02-24: 3 g via INTRAVENOUS
  Filled 2016-02-23: qty 3000

## 2016-02-23 NOTE — Progress Notes (Signed)
Patient notified to arrive at 5:30 

## 2016-02-23 NOTE — Anesthesia Preprocedure Evaluation (Addendum)
Anesthesia Evaluation  Patient identified by MRN, date of birth, ID band Patient awake    Reviewed: Allergy & Precautions, NPO status , Patient's Chart, lab work & pertinent test results  Airway Mallampati: III  TM Distance: >3 FB Neck ROM: Full    Dental  (+) Teeth Intact   Pulmonary shortness of breath, sleep apnea ,    breath sounds clear to auscultation       Cardiovascular hypertension, Pt. on medications  Rhythm:Regular Rate:Normal     Neuro/Psych Seizures -,  PSYCHIATRIC DISORDERS Anxiety Depression  Neuromuscular disease    GI/Hepatic negative GI ROS, Neg liver ROS,   Endo/Other  diabetes, Type 2, Oral Hypoglycemic AgentsHypothyroidism   Renal/GU negative Renal ROS  negative genitourinary   Musculoskeletal  (+) Arthritis , Osteoarthritis,    Abdominal   Peds  Hematology   Anesthesia Other Findings   Reproductive/Obstetrics negative OB ROS                           Lab Results  Component Value Date   WBC 4.2 02/12/2016   HGB 12.3 02/12/2016   HCT 36.0 02/12/2016   MCV 96.8 02/12/2016   PLT 158 02/12/2016   Lab Results  Component Value Date   CREATININE 1.11* 02/12/2016   BUN 33* 02/12/2016   NA 141 02/12/2016   K 4.6 02/12/2016   CL 106 02/12/2016   CO2 26 02/12/2016   Lab Results  Component Value Date   INR 1.00 07/28/2011   INR 1.01 10/13/2010   08/2015 EKG: NSR    Anesthesia Physical Anesthesia Plan  ASA: III  Anesthesia Plan: Spinal   Post-op Pain Management: MAC Combined w/ Regional for Post-op pain   Induction: Intravenous  Airway Management Planned: Natural Airway and Simple Face Mask  Additional Equipment:   Intra-op Plan:   Post-operative Plan:   Informed Consent: I have reviewed the patients History and Physical, chart, labs and discussed the procedure including the risks, benefits and alternatives for the proposed anesthesia with the patient  or authorized representative who has indicated his/her understanding and acceptance.   Dental advisory given  Plan Discussed with: CRNA  Anesthesia Plan Comments: (Will attempt spinal first if patient tolerates. )       Anesthesia Quick Evaluation

## 2016-02-24 ENCOUNTER — Inpatient Hospital Stay (HOSPITAL_COMMUNITY): Payer: Medicare Other

## 2016-02-24 ENCOUNTER — Inpatient Hospital Stay (HOSPITAL_COMMUNITY)
Admission: RE | Admit: 2016-02-24 | Discharge: 2016-02-26 | DRG: 470 | Disposition: A | Discharge status: Skilled Nursing Facility | Payer: Medicare Other | Source: Ambulatory Visit | Attending: Orthopedic Surgery | Admitting: Orthopedic Surgery

## 2016-02-24 ENCOUNTER — Inpatient Hospital Stay (HOSPITAL_COMMUNITY): Payer: Medicare Other | Admitting: Anesthesiology

## 2016-02-24 ENCOUNTER — Inpatient Hospital Stay (HOSPITAL_COMMUNITY): Payer: Medicare Other | Admitting: Vascular Surgery

## 2016-02-24 ENCOUNTER — Encounter (HOSPITAL_COMMUNITY): Payer: Self-pay | Admitting: Anesthesiology

## 2016-02-24 ENCOUNTER — Encounter (HOSPITAL_COMMUNITY)
Admission: RE | Disposition: A | Discharge status: Skilled Nursing Facility | Payer: Self-pay | Source: Ambulatory Visit | Attending: Orthopedic Surgery

## 2016-02-24 DIAGNOSIS — M1711 Unilateral primary osteoarthritis, right knee: Secondary | ICD-10-CM | POA: Diagnosis not present

## 2016-02-24 DIAGNOSIS — F419 Anxiety disorder, unspecified: Secondary | ICD-10-CM | POA: Diagnosis present

## 2016-02-24 DIAGNOSIS — G4733 Obstructive sleep apnea (adult) (pediatric): Secondary | ICD-10-CM | POA: Diagnosis present

## 2016-02-24 DIAGNOSIS — R278 Other lack of coordination: Secondary | ICD-10-CM | POA: Diagnosis not present

## 2016-02-24 DIAGNOSIS — I878 Other specified disorders of veins: Secondary | ICD-10-CM | POA: Diagnosis present

## 2016-02-24 DIAGNOSIS — Z7984 Long term (current) use of oral hypoglycemic drugs: Secondary | ICD-10-CM | POA: Diagnosis not present

## 2016-02-24 DIAGNOSIS — E559 Vitamin D deficiency, unspecified: Secondary | ICD-10-CM | POA: Diagnosis present

## 2016-02-24 DIAGNOSIS — E039 Hypothyroidism, unspecified: Secondary | ICD-10-CM | POA: Diagnosis present

## 2016-02-24 DIAGNOSIS — F329 Major depressive disorder, single episode, unspecified: Secondary | ICD-10-CM | POA: Diagnosis present

## 2016-02-24 DIAGNOSIS — Z96651 Presence of right artificial knee joint: Secondary | ICD-10-CM | POA: Diagnosis not present

## 2016-02-24 DIAGNOSIS — Z471 Aftercare following joint replacement surgery: Secondary | ICD-10-CM | POA: Diagnosis not present

## 2016-02-24 DIAGNOSIS — E7800 Pure hypercholesterolemia, unspecified: Secondary | ICD-10-CM | POA: Diagnosis present

## 2016-02-24 DIAGNOSIS — Z9049 Acquired absence of other specified parts of digestive tract: Secondary | ICD-10-CM | POA: Diagnosis not present

## 2016-02-24 DIAGNOSIS — M6281 Muscle weakness (generalized): Secondary | ICD-10-CM | POA: Diagnosis not present

## 2016-02-24 DIAGNOSIS — G500 Trigeminal neuralgia: Secondary | ICD-10-CM | POA: Diagnosis present

## 2016-02-24 DIAGNOSIS — Z6841 Body Mass Index (BMI) 40.0 and over, adult: Secondary | ICD-10-CM | POA: Diagnosis not present

## 2016-02-24 DIAGNOSIS — I10 Essential (primary) hypertension: Secondary | ICD-10-CM | POA: Diagnosis not present

## 2016-02-24 DIAGNOSIS — Z79899 Other long term (current) drug therapy: Secondary | ICD-10-CM | POA: Diagnosis not present

## 2016-02-24 DIAGNOSIS — Z9119 Patient's noncompliance with other medical treatment and regimen: Secondary | ICD-10-CM | POA: Diagnosis not present

## 2016-02-24 DIAGNOSIS — Z833 Family history of diabetes mellitus: Secondary | ICD-10-CM | POA: Diagnosis not present

## 2016-02-24 DIAGNOSIS — R2681 Unsteadiness on feet: Secondary | ICD-10-CM | POA: Diagnosis not present

## 2016-02-24 DIAGNOSIS — E119 Type 2 diabetes mellitus without complications: Secondary | ICD-10-CM | POA: Diagnosis present

## 2016-02-24 DIAGNOSIS — Z8249 Family history of ischemic heart disease and other diseases of the circulatory system: Secondary | ICD-10-CM | POA: Diagnosis not present

## 2016-02-24 DIAGNOSIS — Z23 Encounter for immunization: Secondary | ICD-10-CM | POA: Diagnosis not present

## 2016-02-24 HISTORY — DX: Unilateral primary osteoarthritis, right knee: M17.11

## 2016-02-24 HISTORY — PX: TOTAL KNEE ARTHROPLASTY: SHX125

## 2016-02-24 HISTORY — DX: Morbid (severe) obesity due to excess calories: E66.01

## 2016-02-24 LAB — GLUCOSE, CAPILLARY
GLUCOSE-CAPILLARY: 113 mg/dL — AB (ref 65–99)
GLUCOSE-CAPILLARY: 121 mg/dL — AB (ref 65–99)
GLUCOSE-CAPILLARY: 146 mg/dL — AB (ref 65–99)
GLUCOSE-CAPILLARY: 155 mg/dL — AB (ref 65–99)

## 2016-02-24 SURGERY — ARTHROPLASTY, KNEE, TOTAL
Anesthesia: Spinal | Laterality: Right

## 2016-02-24 MED ORDER — RIVAROXABAN 15 MG PO TABS: 15.0000 mg | ORAL_TABLET | Freq: Every day | ORAL | Status: DC

## 2016-02-24 MED ORDER — BISACODYL 10 MG RE SUPP: 10.0000 mg | Freq: Every day | RECTAL | Status: DC | PRN

## 2016-02-24 MED ORDER — ONDANSETRON HCL 4 MG/2ML IJ SOLN: 4.0000 mg | Freq: Four times a day (QID) | INTRAMUSCULAR | Status: DC | PRN

## 2016-02-24 MED ORDER — ESCITALOPRAM OXALATE 10 MG PO TABS
20.0000 mg | ORAL_TABLET | Freq: Every day | ORAL | Status: DC
  Administered 2016-02-24 – 2016-02-25 (×2): 20 mg via ORAL
  Filled 2016-02-24 (×2): qty 2

## 2016-02-24 MED ORDER — MEPERIDINE HCL 25 MG/ML IJ SOLN: 6.2500 mg | INTRAMUSCULAR | Status: DC | PRN

## 2016-02-24 MED ORDER — METHOCARBAMOL 500 MG PO TABS
500.0000 mg | ORAL_TABLET | Freq: Four times a day (QID) | ORAL | Status: DC | PRN
  Administered 2016-02-24 – 2016-02-26 (×8): 500 mg via ORAL
  Filled 2016-02-24 (×8): qty 1

## 2016-02-24 MED ORDER — METHOCARBAMOL 500 MG PO TABS
ORAL_TABLET | ORAL | Status: AC
  Filled 2016-02-24: qty 1

## 2016-02-24 MED ORDER — SUCCINYLCHOLINE CHLORIDE 20 MG/ML IJ SOLN
INTRAMUSCULAR | Status: AC
  Filled 2016-02-24: qty 1

## 2016-02-24 MED ORDER — ONDANSETRON HCL 4 MG/2ML IJ SOLN
INTRAMUSCULAR | Status: AC
  Filled 2016-02-24: qty 2

## 2016-02-24 MED ORDER — POLYETHYLENE GLYCOL 3350 17 G PO PACK: 17.0000 g | PACK | Freq: Every day | ORAL | Status: DC | PRN

## 2016-02-24 MED ORDER — LIDOCAINE HCL (CARDIAC) 20 MG/ML IV SOLN
INTRAVENOUS | Status: DC | PRN
  Administered 2016-02-24: 70 mg via INTRAVENOUS

## 2016-02-24 MED ORDER — MAGNESIUM CITRATE PO SOLN: 1.0000 | Freq: Once | ORAL | Status: DC | PRN

## 2016-02-24 MED ORDER — ADULT MULTIVITAMIN W/MINERALS CH
1.0000 | ORAL_TABLET | Freq: Every day | ORAL | Status: DC
  Administered 2016-02-25 – 2016-02-26 (×2): 1 via ORAL
  Filled 2016-02-24 (×2): qty 1

## 2016-02-24 MED ORDER — MIDAZOLAM HCL 5 MG/5ML IJ SOLN
INTRAMUSCULAR | Status: DC | PRN
  Administered 2016-02-24 (×2): 1 mg via INTRAVENOUS

## 2016-02-24 MED ORDER — ALUM & MAG HYDROXIDE-SIMETH 200-200-20 MG/5ML PO SUSP: 30.0000 mL | ORAL | Status: DC | PRN

## 2016-02-24 MED ORDER — DEXTROSE 5 % IV SOLN
3.0000 g | Freq: Four times a day (QID) | INTRAVENOUS | Status: AC
  Administered 2016-02-24 (×2): 3 g via INTRAVENOUS
  Filled 2016-02-24 (×3): qty 3000

## 2016-02-24 MED ORDER — FENTANYL CITRATE (PF) 100 MCG/2ML IJ SOLN
25.0000 ug | INTRAMUSCULAR | Status: DC | PRN
  Administered 2016-02-24 (×4): 25 ug via INTRAVENOUS

## 2016-02-24 MED ORDER — LACTATED RINGERS IV SOLN: INTRAVENOUS | Status: DC

## 2016-02-24 MED ORDER — SODIUM CHLORIDE 0.9 % IR SOLN
Status: DC | PRN
Start: 2016-02-24 — End: 2016-02-24
  Administered 2016-02-24: 1000 mL

## 2016-02-24 MED ORDER — DOCUSATE SODIUM 100 MG PO CAPS
100.0000 mg | ORAL_CAPSULE | Freq: Two times a day (BID) | ORAL | Status: DC
  Administered 2016-02-24 – 2016-02-26 (×4): 100 mg via ORAL
  Filled 2016-02-24 (×4): qty 1

## 2016-02-24 MED ORDER — DIPHENHYDRAMINE HCL 12.5 MG/5ML PO ELIX: 12.5000 mg | ORAL_SOLUTION | ORAL | Status: DC | PRN

## 2016-02-24 MED ORDER — VALACYCLOVIR HCL 500 MG PO TABS
500.0000 mg | ORAL_TABLET | Freq: Every day | ORAL | Status: DC
  Administered 2016-02-25 – 2016-02-26 (×2): 500 mg via ORAL
  Filled 2016-02-24 (×2): qty 1

## 2016-02-24 MED ORDER — GLYCOPYRROLATE 0.2 MG/ML IJ SOLN
INTRAMUSCULAR | Status: DC | PRN
  Administered 2016-02-24: 0.1 mg via INTRAVENOUS

## 2016-02-24 MED ORDER — OXYBUTYNIN CHLORIDE ER 15 MG PO TB24
15.0000 mg | ORAL_TABLET | Freq: Every morning | ORAL | Status: DC
  Administered 2016-02-25 – 2016-02-26 (×2): 15 mg via ORAL
  Filled 2016-02-24 (×4): qty 1

## 2016-02-24 MED ORDER — LEVOTHYROXINE SODIUM 100 MCG PO TABS
200.0000 ug | ORAL_TABLET | Freq: Every day | ORAL | Status: DC
  Administered 2016-02-25 – 2016-02-26 (×2): 200 ug via ORAL
  Filled 2016-02-24 (×2): qty 2

## 2016-02-24 MED ORDER — POTASSIUM CHLORIDE IN NACL 20-0.45 MEQ/L-% IV SOLN
INTRAVENOUS | Status: DC
  Filled 2016-02-24 (×5): qty 1000

## 2016-02-24 MED ORDER — ATORVASTATIN CALCIUM 10 MG PO TABS
10.0000 mg | ORAL_TABLET | Freq: Every day | ORAL | Status: DC
  Administered 2016-02-24 – 2016-02-25 (×2): 10 mg via ORAL
  Filled 2016-02-24 (×2): qty 1

## 2016-02-24 MED ORDER — 0.9 % SODIUM CHLORIDE (POUR BTL) OPTIME
TOPICAL | Status: DC | PRN
  Administered 2016-02-24: 1000 mL

## 2016-02-24 MED ORDER — ACETAMINOPHEN 325 MG PO TABS: 650.0000 mg | ORAL_TABLET | Freq: Four times a day (QID) | ORAL | Status: DC | PRN

## 2016-02-24 MED ORDER — OXYCODONE HCL 5 MG PO TABS
ORAL_TABLET | ORAL | Status: AC
  Filled 2016-02-24: qty 2

## 2016-02-24 MED ORDER — PROPOFOL 10 MG/ML IV BOLUS
INTRAVENOUS | Status: AC
  Filled 2016-02-24: qty 40

## 2016-02-24 MED ORDER — ACETAMINOPHEN 650 MG RE SUPP: 650.0000 mg | Freq: Four times a day (QID) | RECTAL | Status: DC | PRN

## 2016-02-24 MED ORDER — GABAPENTIN 400 MG PO CAPS
400.0000 mg | ORAL_CAPSULE | Freq: Two times a day (BID) | ORAL | Status: DC
  Administered 2016-02-24 – 2016-02-26 (×4): 400 mg via ORAL
  Filled 2016-02-24 (×4): qty 1

## 2016-02-24 MED ORDER — BUPIVACAINE HCL (PF) 0.5 % IJ SOLN
INTRAMUSCULAR | Status: AC
  Filled 2016-02-24: qty 30

## 2016-02-24 MED ORDER — PHENYLEPHRINE HCL 10 MG/ML IJ SOLN
INTRAMUSCULAR | Status: DC | PRN
  Administered 2016-02-24 (×2): 80 ug via INTRAVENOUS

## 2016-02-24 MED ORDER — TRAMADOL HCL 50 MG PO TABS
50.0000 mg | ORAL_TABLET | Freq: Four times a day (QID) | ORAL | Status: DC | PRN
  Administered 2016-02-24 – 2016-02-25 (×2): 50 mg via ORAL
  Filled 2016-02-24 (×2): qty 1

## 2016-02-24 MED ORDER — LISINOPRIL 20 MG PO TABS
20.0000 mg | ORAL_TABLET | Freq: Every day | ORAL | Status: DC
  Administered 2016-02-25 – 2016-02-26 (×2): 20 mg via ORAL
  Filled 2016-02-24 (×2): qty 1

## 2016-02-24 MED ORDER — BUPIVACAINE HCL (PF) 0.5 % IJ SOLN
INTRAMUSCULAR | Status: DC | PRN
  Administered 2016-02-24: 30 mL

## 2016-02-24 MED ORDER — ACETAMINOPHEN 10 MG/ML IV SOLN
INTRAVENOUS | Status: DC | PRN
  Administered 2016-02-24: 1000 mg via INTRAVENOUS

## 2016-02-24 MED ORDER — PHENYLEPHRINE 40 MCG/ML (10ML) SYRINGE FOR IV PUSH (FOR BLOOD PRESSURE SUPPORT)
PREFILLED_SYRINGE | INTRAVENOUS | Status: AC
  Filled 2016-02-24: qty 10

## 2016-02-24 MED ORDER — KETOROLAC TROMETHAMINE 30 MG/ML IJ SOLN
INTRAMUSCULAR | Status: AC
  Filled 2016-02-24: qty 1

## 2016-02-24 MED ORDER — METHOCARBAMOL 1000 MG/10ML IJ SOLN
500.0000 mg | Freq: Four times a day (QID) | INTRAVENOUS | Status: DC | PRN
  Filled 2016-02-24: qty 5

## 2016-02-24 MED ORDER — LACTATED RINGERS IV SOLN
INTRAVENOUS | Status: DC | PRN
  Administered 2016-02-24 (×2): via INTRAVENOUS

## 2016-02-24 MED ORDER — MENTHOL 3 MG MT LOZG: 1.0000 | LOZENGE | OROMUCOSAL | Status: DC | PRN

## 2016-02-24 MED ORDER — BACLOFEN 10 MG PO TABS: 10.0000 mg | ORAL_TABLET | Freq: Three times a day (TID) | ORAL | Status: DC

## 2016-02-24 MED ORDER — ROCURONIUM BROMIDE 50 MG/5ML IV SOLN
INTRAVENOUS | Status: AC
  Filled 2016-02-24: qty 1

## 2016-02-24 MED ORDER — SUCCINYLCHOLINE CHLORIDE 20 MG/ML IJ SOLN
INTRAMUSCULAR | Status: DC | PRN
  Administered 2016-02-24: 130 mg via INTRAVENOUS

## 2016-02-24 MED ORDER — ONDANSETRON HCL 4 MG PO TABS: 4.0000 mg | ORAL_TABLET | Freq: Four times a day (QID) | ORAL | Status: DC | PRN

## 2016-02-24 MED ORDER — INSULIN ASPART 100 UNIT/ML ~~LOC~~ SOLN
0.0000 [IU] | Freq: Three times a day (TID) | SUBCUTANEOUS | Status: DC
  Administered 2016-02-25: 3 [IU] via SUBCUTANEOUS
  Administered 2016-02-25: 2 [IU] via SUBCUTANEOUS
  Administered 2016-02-26: 3 [IU] via SUBCUTANEOUS

## 2016-02-24 MED ORDER — OXYCODONE-ACETAMINOPHEN 10-325 MG PO TABS: 1.0000 | ORAL_TABLET | Freq: Four times a day (QID) | ORAL | Status: DC | PRN

## 2016-02-24 MED ORDER — OXYCODONE HCL 5 MG PO TABS
5.0000 mg | ORAL_TABLET | ORAL | Status: DC | PRN
  Administered 2016-02-24 – 2016-02-26 (×11): 10 mg via ORAL
  Filled 2016-02-24 (×11): qty 2

## 2016-02-24 MED ORDER — FENTANYL CITRATE (PF) 100 MCG/2ML IJ SOLN
INTRAMUSCULAR | Status: DC | PRN
  Administered 2016-02-24 (×9): 50 ug via INTRAVENOUS

## 2016-02-24 MED ORDER — RIVAROXABAN 10 MG PO TABS
10.0000 mg | ORAL_TABLET | Freq: Every day | ORAL | Status: DC
  Administered 2016-02-25 – 2016-02-26 (×2): 10 mg via ORAL
  Filled 2016-02-24 (×2): qty 1

## 2016-02-24 MED ORDER — PROPOFOL 10 MG/ML IV BOLUS
INTRAVENOUS | Status: DC | PRN
  Administered 2016-02-24: 160 mg via INTRAVENOUS

## 2016-02-24 MED ORDER — FENTANYL CITRATE (PF) 250 MCG/5ML IJ SOLN
INTRAMUSCULAR | Status: AC
  Filled 2016-02-24: qty 5

## 2016-02-24 MED ORDER — HYDROMORPHONE HCL 1 MG/ML IJ SOLN: 1.0000 mg | INTRAMUSCULAR | Status: DC | PRN

## 2016-02-24 MED ORDER — FENTANYL CITRATE (PF) 100 MCG/2ML IJ SOLN
INTRAMUSCULAR | Status: AC
  Filled 2016-02-24: qty 2

## 2016-02-24 MED ORDER — KETOROLAC TROMETHAMINE 30 MG/ML IJ SOLN
INTRAMUSCULAR | Status: DC | PRN
  Administered 2016-02-24: 30 mg via INTRAVENOUS

## 2016-02-24 MED ORDER — METOCLOPRAMIDE HCL 5 MG PO TABS: 5.0000 mg | ORAL_TABLET | Freq: Three times a day (TID) | ORAL | Status: DC | PRN

## 2016-02-24 MED ORDER — DEXAMETHASONE SODIUM PHOSPHATE 10 MG/ML IJ SOLN
10.0000 mg | Freq: Once | INTRAMUSCULAR | Status: AC
  Administered 2016-02-25: 10 mg via INTRAVENOUS
  Filled 2016-02-24: qty 1

## 2016-02-24 MED ORDER — KETOROLAC TROMETHAMINE 15 MG/ML IJ SOLN
7.5000 mg | Freq: Four times a day (QID) | INTRAMUSCULAR | Status: AC
Start: 2016-02-24 — End: 2016-02-25
  Administered 2016-02-24 – 2016-02-25 (×4): 7.5 mg via INTRAVENOUS
  Filled 2016-02-24 (×4): qty 1

## 2016-02-24 MED ORDER — PHENOL 1.4 % MT LIQD: 1.0000 | OROMUCOSAL | Status: DC | PRN

## 2016-02-24 MED ORDER — VITAMIN D 1000 UNITS PO TABS
1000.0000 [IU] | ORAL_TABLET | Freq: Every day | ORAL | Status: DC
  Administered 2016-02-25 – 2016-02-26 (×2): 1000 [IU] via ORAL
  Filled 2016-02-24 (×2): qty 1

## 2016-02-24 MED ORDER — DEXAMETHASONE SODIUM PHOSPHATE 4 MG/ML IJ SOLN
INTRAMUSCULAR | Status: DC | PRN
  Administered 2016-02-24: 4 mg via INTRAVENOUS

## 2016-02-24 MED ORDER — MIDAZOLAM HCL 2 MG/2ML IJ SOLN
INTRAMUSCULAR | Status: AC
  Filled 2016-02-24: qty 2

## 2016-02-24 MED ORDER — ACETAMINOPHEN 10 MG/ML IV SOLN
INTRAVENOUS | Status: AC
  Filled 2016-02-24: qty 100

## 2016-02-24 MED ORDER — SENNA-DOCUSATE SODIUM 8.6-50 MG PO TABS
2.0000 | ORAL_TABLET | Freq: Every day | ORAL | Status: DC
Start: 2016-02-24 — End: 2016-08-11

## 2016-02-24 MED ORDER — BUPIVACAINE-EPINEPHRINE (PF) 0.5% -1:200000 IJ SOLN
INTRAMUSCULAR | Status: DC | PRN
  Administered 2016-02-24: 30 mL via PERINEURAL

## 2016-02-24 MED ORDER — KETOROLAC TROMETHAMINE 15 MG/ML IJ SOLN
INTRAMUSCULAR | Status: AC
Start: 2016-02-24 — End: 2016-02-25
  Filled 2016-02-24: qty 1

## 2016-02-24 MED ORDER — INFLUENZA VAC SPLIT QUAD 0.5 ML IM SUSY
0.5000 mL | PREFILLED_SYRINGE | INTRAMUSCULAR | Status: AC
  Administered 2016-02-26: 0.5 mL via INTRAMUSCULAR
  Filled 2016-02-24: qty 0.5

## 2016-02-24 MED ORDER — ONDANSETRON HCL 4 MG PO TABS: 4.0000 mg | ORAL_TABLET | Freq: Three times a day (TID) | ORAL | Status: DC | PRN

## 2016-02-24 MED ORDER — METOCLOPRAMIDE HCL 5 MG/ML IJ SOLN: 5.0000 mg | Freq: Three times a day (TID) | INTRAMUSCULAR | Status: DC | PRN

## 2016-02-24 MED ORDER — LIDOCAINE HCL (CARDIAC) 20 MG/ML IV SOLN
INTRAVENOUS | Status: AC
  Filled 2016-02-24: qty 5

## 2016-02-24 MED ORDER — SENNA 8.6 MG PO TABS
1.0000 | ORAL_TABLET | Freq: Two times a day (BID) | ORAL | Status: DC
  Administered 2016-02-24 – 2016-02-26 (×4): 8.6 mg via ORAL
  Filled 2016-02-24 (×4): qty 1

## 2016-02-24 MED ORDER — PROMETHAZINE HCL 25 MG/ML IJ SOLN: 6.2500 mg | INTRAMUSCULAR | Status: DC | PRN

## 2016-02-24 MED ORDER — ONDANSETRON HCL 4 MG/2ML IJ SOLN
INTRAMUSCULAR | Status: DC | PRN
  Administered 2016-02-24: 4 mg via INTRAVENOUS

## 2016-02-24 SURGICAL SUPPLY — 67 items
BANDAGE ELASTIC 6 VELCRO ST LF (GAUZE/BANDAGES/DRESSINGS) ×2 IMPLANT
BANDAGE ESMARK 6X9 LF (GAUZE/BANDAGES/DRESSINGS) ×1 IMPLANT
BENZOIN TINCTURE PRP APPL 2/3 (GAUZE/BANDAGES/DRESSINGS) ×2 IMPLANT
BLADE SAG 18X100X1.27 (BLADE) ×2 IMPLANT
BLADE SAW RECIP 87.9 MT (BLADE) ×4 IMPLANT
BLADE SAW SGTL 13X75X1.27 (BLADE) ×2 IMPLANT
BNDG ESMARK 6X9 LF (GAUZE/BANDAGES/DRESSINGS) ×2
BOOTCOVER CLEANROOM LRG (PROTECTIVE WEAR) ×4 IMPLANT
BOWL SMART MIX CTS (DISPOSABLE) ×2 IMPLANT
CAPT KNEE TOTAL 3 ATTUNE ×2 IMPLANT
CEMENT HV SMART SET (Cement) ×4 IMPLANT
CLSR STERI-STRIP ANTIMIC 1/2X4 (GAUZE/BANDAGES/DRESSINGS) ×2 IMPLANT
COVER SURGICAL LIGHT HANDLE (MISCELLANEOUS) ×2 IMPLANT
CUFF TOURNIQUET SINGLE 34IN LL (TOURNIQUET CUFF) ×2 IMPLANT
DRAPE EXTREMITY T 121X128X90 (DRAPE) ×2 IMPLANT
DRAPE U-SHAPE 47X51 STRL (DRAPES) ×2 IMPLANT
DURAPREP 26ML APPLICATOR (WOUND CARE) ×2 IMPLANT
ELECT CAUTERY BLADE 6.4 (BLADE) ×2 IMPLANT
ELECT REM PT RETURN 9FT ADLT (ELECTROSURGICAL) ×2
ELECTRODE REM PT RTRN 9FT ADLT (ELECTROSURGICAL) ×1 IMPLANT
FACESHIELD STD STERILE (MASK) ×2 IMPLANT
GAUZE SPONGE 4X4 12PLY STRL (GAUZE/BANDAGES/DRESSINGS) ×2 IMPLANT
GLOVE BIOGEL PI IND STRL 7.0 (GLOVE) ×1 IMPLANT
GLOVE BIOGEL PI IND STRL 7.5 (GLOVE) ×1 IMPLANT
GLOVE BIOGEL PI IND STRL 8 (GLOVE) ×2 IMPLANT
GLOVE BIOGEL PI INDICATOR 7.0 (GLOVE) ×1
GLOVE BIOGEL PI INDICATOR 7.5 (GLOVE) ×1
GLOVE BIOGEL PI INDICATOR 8 (GLOVE) ×2
GLOVE BIOGEL PI ORTHO PRO SZ8 (GLOVE)
GLOVE ORTHO TXT STRL SZ7.5 (GLOVE) IMPLANT
GLOVE PI ORTHO PRO STRL SZ8 (GLOVE) IMPLANT
GLOVE SURG ORTHO 8.0 STRL STRW (GLOVE) IMPLANT
GLOVE SURG SS PI 7.0 STRL IVOR (GLOVE) ×2 IMPLANT
GLOVE SURG SS PI 7.5 STRL IVOR (GLOVE) ×4 IMPLANT
GOWN STRL REUS W/ TWL XL LVL3 (GOWN DISPOSABLE) ×2 IMPLANT
GOWN STRL REUS W/TWL 2XL LVL3 (GOWN DISPOSABLE) ×2 IMPLANT
GOWN STRL REUS W/TWL XL LVL3 (GOWN DISPOSABLE) ×2
HANDPIECE INTERPULSE COAX TIP (DISPOSABLE) ×1
HOOD PEEL AWAY FACE SHEILD DIS (HOOD) ×8 IMPLANT
IMMOBILIZER KNEE 22 (SOFTGOODS) ×2 IMPLANT
KIT BASIN OR (CUSTOM PROCEDURE TRAY) ×2 IMPLANT
KIT ROOM TURNOVER OR (KITS) ×2 IMPLANT
MANIFOLD NEPTUNE II (INSTRUMENTS) ×2 IMPLANT
NEEDLE 18GX1X1/2 (RX/OR ONLY) (NEEDLE) ×2 IMPLANT
NS IRRIG 1000ML POUR BTL (IV SOLUTION) ×2 IMPLANT
PACK TOTAL JOINT (CUSTOM PROCEDURE TRAY) ×2 IMPLANT
PACK UNIVERSAL I (CUSTOM PROCEDURE TRAY) ×2 IMPLANT
PAD ABD 8X10 STRL (GAUZE/BANDAGES/DRESSINGS) ×2 IMPLANT
PAD ARMBOARD 7.5X6 YLW CONV (MISCELLANEOUS) ×4 IMPLANT
PAD CAST 4YDX4 CTTN HI CHSV (CAST SUPPLIES) ×1 IMPLANT
PADDING CAST COTTON 4X4 STRL (CAST SUPPLIES) ×1
PADDING CAST COTTON 6X4 STRL (CAST SUPPLIES) ×2 IMPLANT
SET HNDPC FAN SPRY TIP SCT (DISPOSABLE) ×1 IMPLANT
SUCTION FRAZIER HANDLE 10FR (MISCELLANEOUS) ×1
SUCTION TUBE FRAZIER 10FR DISP (MISCELLANEOUS) ×1 IMPLANT
SUT MNCRL AB 4-0 PS2 18 (SUTURE) IMPLANT
SUT VIC AB 0 CT1 27 (SUTURE) ×1
SUT VIC AB 0 CT1 27XBRD ANBCTR (SUTURE) ×1 IMPLANT
SUT VIC AB 2-0 CT1 27 (SUTURE) ×1
SUT VIC AB 2-0 CT1 TAPERPNT 27 (SUTURE) ×1 IMPLANT
SUT VIC AB 3-0 SH 8-18 (SUTURE) ×4 IMPLANT
SYR 30ML LL (SYRINGE) IMPLANT
SYR 50ML LL SCALE MARK (SYRINGE) ×2 IMPLANT
TOWEL OR 17X24 6PK STRL BLUE (TOWEL DISPOSABLE) ×2 IMPLANT
TOWEL OR 17X26 10 PK STRL BLUE (TOWEL DISPOSABLE) ×2 IMPLANT
TRAY CATH 16FR W/PLASTIC CATH (SET/KITS/TRAYS/PACK) IMPLANT
WATER STERILE IRR 1000ML POUR (IV SOLUTION) ×4 IMPLANT

## 2016-02-24 NOTE — Progress Notes (Signed)
Orthopedic Tech Progress Note Patient Details:  Kathy Young 04/25/1946 [id number]  Ortho Devices Type of Ortho Device: Knee Immobilizer Ortho Device/Splint Interventions: Application   Saul FordyceJennifer C Rashunda Passon 02/24/2016, 4:10 PM

## 2016-02-24 NOTE — Transfer of Care (Signed)
Immediate Anesthesia Transfer of Care Note  Patient: Kathy Young  Procedure(s) Performed: Procedure(s): TOTAL KNEE ARTHROPLASTY (Right)  Patient Location: PACU  Anesthesia Type:General  Level of Consciousness: awake, alert  and oriented  Airway & Oxygen Therapy: Patient Spontanous Breathing and Patient connected to nasal cannula oxygen  Post-op Assessment: Report given to RN and Post -op Vital signs reviewed and stable  Post vital signs: Reviewed and stable  Last Vitals:  Filed Vitals:   02/24/16 0630 02/24/16 1100  BP: 116/66   Pulse: 84   Temp: 36.8 C 36.6 C  Resp: 18     Complications: No apparent anesthesia complications

## 2016-02-24 NOTE — Progress Notes (Signed)
Utilization review completed.  

## 2016-02-24 NOTE — Anesthesia Procedure Notes (Addendum)
Procedure Name: Intubation Date/Time: 02/24/2016 8:00 AM Performed by: Leonel Ramsay'LAUGHLIN, KAREN H Pre-anesthesia Checklist: Patient identified, Timeout performed, Emergency Drugs available, Suction available and Patient being monitored Patient Re-evaluated:Patient Re-evaluated prior to inductionOxygen Delivery Method: Circle system utilized Preoxygenation: Pre-oxygenation with 100% oxygen Intubation Type: IV induction Ventilation: Mask ventilation without difficulty Laryngoscope Size: Mac and 4 Grade View: Grade I Tube type: Oral Tube size: 7.0 mm Number of attempts: 1 Airway Equipment and Method: Stylet Placement Confirmation: ETT inserted through vocal cords under direct vision,  positive ETCO2 and breath sounds checked- equal and bilateral Secured at: 22 cm Tube secured with: Tape Dental Injury: Teeth and Oropharynx as per pre-operative assessment    Anesthesia Regional Block:  Adductor canal block  Pre-Anesthetic Checklist: ,, timeout performed, Correct Patient, Correct Site, Correct Laterality, Correct Procedure, Correct Position, site marked, Risks and benefits discussed,  Surgical consent,  Pre-op evaluation,  At surgeon's request and post-op pain management  Laterality: Right  Prep: chloraprep       Needles:  Injection technique: Single-shot  Needle Type: Echogenic Needle     Needle Length: 9cm 9 cm Needle Gauge: 21 and 21 G    Additional Needles:  Procedures: ultrasound guided (picture in chart) Adductor canal block Narrative:  Start time: 02/24/2016 11:24 AM End time: 02/24/2016 11:28 AM Injection made incrementally with aspirations every 5 mL.  Performed by: Personally  Anesthesiologist: Shona SimpsonHOLLIS, KEVIN D  Additional Notes: No immediate complications noted.

## 2016-02-24 NOTE — Progress Notes (Signed)
Abductor canal block done at bedside by Dr. Hart RochesterHollis. A time out was performed prior to the procedure.

## 2016-02-24 NOTE — Anesthesia Postprocedure Evaluation (Signed)
Anesthesia Post Note  Patient: Kathy Young  Procedure(s) Performed: Procedure(s) (LRB): TOTAL KNEE ARTHROPLASTY (Right)  Patient location during evaluation: PACU Anesthesia Type: General and Regional Level of consciousness: awake and alert Pain management: pain level controlled Vital Signs Assessment: post-procedure vital signs reviewed and stable Respiratory status: spontaneous breathing, nonlabored ventilation, respiratory function stable and patient connected to nasal cannula oxygen Cardiovascular status: blood pressure returned to baseline and stable Postop Assessment: no signs of nausea or vomiting Anesthetic complications: no    Last Vitals:  Filed Vitals:   02/24/16 1245 02/24/16 1300  BP: 126/63   Pulse: 97   Temp:  36.8 C  Resp: 14     Last Pain:  Filed Vitals:   02/24/16 1302  PainSc: Asleep                 Shelton SilvasKevin D Der Gagliano

## 2016-02-24 NOTE — Discharge Instructions (Signed)
INSTRUCTIONS AFTER JOINT REPLACEMENT  ° °o Remove items at home which could result in a fall. This includes throw rugs or furniture in walking pathways °o ICE to the affected joint every three hours while awake for 30 minutes at a time, for at least the first 3-5 days, and then as needed for pain and swelling.  Continue to use ice for pain and swelling. You may notice swelling that will progress down to the foot and ankle.  This is normal after surgery.  Elevate your leg when you are not up walking on it.   °o Continue to use the breathing machine you got in the hospital (incentive spirometer) which will help keep your temperature down.  It is common for your temperature to cycle up and down following surgery, especially at night when you are not up moving around and exerting yourself.  The breathing machine keeps your lungs expanded and your temperature down. ° ° °DIET:  As you were doing prior to hospitalization, we recommend a well-balanced diet. ° °DRESSING / WOUND CARE / SHOWERING ° °You may change your dressing 3-5 days after surgery.  Then change the dressing every day with sterile gauze.  Please use good hand washing techniques before changing the dressing.  Do not use any lotions or creams on the incision until instructed by your surgeon. ° °ACTIVITY ° °o Increase activity slowly as tolerated, but follow the weight bearing instructions below.   °o No driving for 6 weeks or until further direction given by your physician.  You cannot drive while taking narcotics.  °o No lifting or carrying greater than 10 lbs. until further directed by your surgeon. °o Avoid periods of inactivity such as sitting longer than an hour when not asleep. This helps prevent blood clots.  °o You may return to work once you are authorized by your doctor.  ° ° ° °WEIGHT BEARING  ° °Weight bearing as tolerated with assist device (walker, cane, etc) as directed, use it as long as suggested by your surgeon or therapist, typically at  least 4-6 weeks. ° ° °EXERCISES ° °Results after joint replacement surgery are often greatly improved when you follow the exercise, range of motion and muscle strengthening exercises prescribed by your doctor. Safety measures are also important to protect the joint from further injury. Any time any of these exercises cause you to have increased pain or swelling, decrease what you are doing until you are comfortable again and then slowly increase them. If you have problems or questions, call your caregiver or physical therapist for advice.  ° °Rehabilitation is important following a joint replacement. After just a few days of immobilization, the muscles of the leg can become weakened and shrink (atrophy).  These exercises are designed to build up the tone and strength of the thigh and leg muscles and to improve motion. Often times heat used for twenty to thirty minutes before working out will loosen up your tissues and help with improving the range of motion but do not use heat for the first two weeks following surgery (sometimes heat can increase post-operative swelling).  ° °These exercises can be done on a training (exercise) mat, on the floor, on a table or on a bed. Use whatever works the best and is most comfortable for you.    Use music or television while you are exercising so that the exercises are a pleasant break in your day. This will make your life better with the exercises acting as a break   in your routine that you can look forward to.   Perform all exercises about fifteen times, three times per day or as directed.  You should exercise both the operative leg and the other leg as well. ° °Exercises include: °  °• Quad Sets - Tighten up the muscle on the front of the thigh (Quad) and hold for 5-10 seconds.   °• Straight Leg Raises - With your knee straight (if you were given a brace, keep it on), lift the leg to 60 degrees, hold for 3 seconds, and slowly lower the leg.  Perform this exercise against  resistance later as your leg gets stronger.  °• Leg Slides: Lying on your back, slowly slide your foot toward your buttocks, bending your knee up off the floor (only go as far as is comfortable). Then slowly slide your foot back down until your leg is flat on the floor again.  °• Angel Wings: Lying on your back spread your legs to the side as far apart as you can without causing discomfort.  °• Hamstring Strength:  Lying on your back, push your heel against the floor with your leg straight by tightening up the muscles of your buttocks.  Repeat, but this time bend your knee to a comfortable angle, and push your heel against the floor.  You may put a pillow under the heel to make it more comfortable if necessary.  ° °A rehabilitation program following joint replacement surgery can speed recovery and prevent re-injury in the future due to weakened muscles. Contact your doctor or a physical therapist for more information on knee rehabilitation.  ° ° °CONSTIPATION ° °Constipation is defined medically as fewer than three stools per week and severe constipation as less than one stool per week.  Even if you have a regular bowel pattern at home, your normal regimen is likely to be disrupted due to multiple reasons following surgery.  Combination of anesthesia, postoperative narcotics, change in appetite and fluid intake all can affect your bowels.  ° °YOU MUST use at least one of the following options; they are listed in order of increasing strength to get the job done.  They are all available over the counter, and you may need to use some, POSSIBLY even all of these options:   ° °Drink plenty of fluids (prune juice may be helpful) and high fiber foods °Colace 100 mg by mouth twice a day  °Senokot for constipation as directed and as needed Dulcolax (bisacodyl), take with full glass of water  °Miralax (polyethylene glycol) once or twice a day as needed. ° °If you have tried all these things and are unable to have a bowel  movement in the first 3-4 days after surgery call either your surgeon or your primary doctor.   ° °If you experience loose stools or diarrhea, hold the medications until you stool forms back up.  If your symptoms do not get better within 1 week or if they get worse, check with your doctor.  If you experience "the worst abdominal pain ever" or develop nausea or vomiting, please contact the office immediately for further recommendations for treatment. ° ° °ITCHING:  If you experience itching with your medications, try taking only a single pain pill, or even half a pain pill at a time.  You can also use Benadryl over the counter for itching or also to help with sleep.  ° °TED HOSE STOCKINGS:  Use stockings on both legs until for at least 2 weeks or as   directed by physician office. They may be removed at night for sleeping.  MEDICATIONS:  See your medication summary on the After Visit Summary that nursing will review with you.  You may have some home medications which will be placed on hold until you complete the course of blood thinner medication.  It is important for you to complete the blood thinner medication as prescribed.  PRECAUTIONS:  If you experience chest pain or shortness of breath - call 911 immediately for transfer to the hospital emergency department.   If you develop a fever greater that 101 F, purulent drainage from wound, increased redness or drainage from wound, foul odor from the wound/dressing, or calf pain - CONTACT YOUR SURGEON.                                                   FOLLOW-UP APPOINTMENTS:  If you do not already have a post-op appointment, please call the office for an appointment to be seen by your surgeon.  Guidelines for how soon to be seen are listed in your After Visit Summary, but are typically between 1-4 weeks after surgery.  OTHER INSTRUCTIONS:   Knee Replacement:  Do not place pillow under knee, focus on keeping the knee straight while resting.   MAKE SURE  YOU:   Understand these instructions.   Get help right away if you are not doing well or get worse.    Thank you for letting us be a part of your medical care team.  It is a privilege we respect greatly.  We hope these instructions will help you stay on track for a fast and full recovery!   Information on my medicine - XARELTO (Rivaroxaban)  This medication education was reviewed with me or my healthcare representative as part of my discharge preparation.  The pharmacist that spoke with me during my hospital stay was:  Herby AbrahamBell, Tu Bayle T, [facility]  Why was Xarelto prescribed for you? Xarelto was prescribed for you to reduce the risk of blood clots forming after orthopedic surgery. The medical term for these abnormal blood clots is venous thromboembolism (VTE).  What do you need to know about xarelto ? Take your Xarelto ONCE DAILY at the same time every day. You may take it either with or without food.  If you have difficulty swallowing the tablet whole, you may crush it and mix in applesauce just prior to taking your dose.  Take Xarelto exactly as prescribed by your doctor and DO NOT stop taking Xarelto without talking to the doctor who prescribed the medication.  Stopping without other VTE prevention medication to take the place of Xarelto may increase your risk of developing a clot.  After discharge, you should have regular check-up appointments with your healthcare provider that is prescribing your Xarelto.    What do you do if you miss a dose? If you miss a dose, take it as soon as you remember on the same day then continue your regularly scheduled once daily regimen the next day. Do not take two doses of Xarelto on the same day.   Important Safety Information A possible side effect of Xarelto is bleeding. You should call your healthcare provider right away if you experience any of the following: ? Bleeding from an injury or your nose that does not stop. ? Unusual colored  urine (red  or dark brown) or unusual colored stools (red or black). ? Unusual bruising for unknown reasons. ? A serious fall or if you hit your head (even if there is no bleeding).  Some medicines may interact with Xarelto and might increase your risk of bleeding while on Xarelto. To help avoid this, consult your healthcare provider or pharmacist prior to using any new prescription or non-prescription medications, including herbals, vitamins, non-steroidal anti-inflammatory drugs (NSAIDs) and supplements.  This website has more information on Xarelto: VisitDestination.com.br.

## 2016-02-24 NOTE — Progress Notes (Signed)
Patient reports having about $10 and cell phone with her. I offered to have this locked up with security and patient refused. Stated it was fine going to PACU. Explained to patient Kathy Young could not be responsible.

## 2016-02-24 NOTE — Op Note (Signed)
DATE OF SURGERY:  02/24/2016 TIME: 10:13 AM  PATIENT NAME:  Kathy Young   AGE: 70 y.o.    PRE-OPERATIVE DIAGNOSIS:  DJD RIGHT KNEE  POST-OPERATIVE DIAGNOSIS:  Same  PROCEDURE:  Procedure(s): TOTAL KNEE ARTHROPLASTY   SURGEON:  Eulas Post, MD   ASSISTANT:  Janace Litten, OPA-C, present and scrubbed throughout the case, critical for assistance with exposure, retraction, instrumentation, and closure.   OPERATIVE IMPLANTS: Depuy Attune size 6 femur standard, 6 tibia, 6 polyethylene insert with a 32 mm patellar button   PREOPERATIVE INDICATIONS:  Kathy Young is a 70 y.o. year old female with end stage bone on bone degenerative arthritis of the knee who failed conservative treatment, including injections, antiinflammatories, activity modification, and assistive devices, and had significant impairment of their activities of daily living, and elected for Total Knee Arthroplasty.  She has known morbid obesity, and has worked for years to try and improve her BMI, and also has chronic bilateral venous stasis and uses compression garments regularly. All of these factors significantly increase her perioperative risk, which she understands and wishes to proceed.  The risks, benefits, and alternatives were discussed at length including but not limited to the risks of infection, bleeding, nerve injury, stiffness, blood clots, the need for revision surgery, cardiopulmonary complications, among others, and they were willing to proceed.  OPERATIVE FINDINGS AND UNIQUE ASPECTS OF THE CASE:  The patella measured 18 mm before the cut. For this reason I had to do a free hand cut, resulting in a 15 mm patella, that measured to approximately 23 mm with the trial button in place. Also she had a 20 flexion contracture preoperatively, and I resected the distal femur at 10, and also made a fairly healthy resection on the proximal tibia resulting in satisfactory gap formation.  OPERATIVE  DESCRIPTION:  The patient was brought to the operative room and placed in a supine position.  Spinal anesthesia was attempted, and abandoned, and then general anesthesia was administered.  IV antibiotics were given.  The lower extremity was prepped and draped in the usual sterile fashion.  Time out was performed.  The leg was elevated and exsanguinated and the tourniquet was inflated.  Anterior quadriceps tendon splitting approach was performed.  The patella was everted and osteophytes were removed.  The anterior horn of the medial and lateral meniscus was removed.   The distal femur was opened with the drill and the intramedullary distal femoral cutting jig was utilized, set at 5 degrees resecting 10 mm off the distal femur.  Care was taken to protect the collateral ligaments.  Then the extramedullary tibial cutting jig was utilized making the appropriate cut using the anterior tibial crest as a reference building in appropriate posterior slope.  Care was taken during the cut to protect the medial and collateral ligaments.  The proximal tibia was removed along with the posterior horns of the menisci.  The PCL was sacrificed.    The extensor gap was measured and was approximately 10mm.    The distal femoral sizing jig was applied, taking care to avoid notching.  Then the 4-in-1 cutting jig was applied and the anterior and posterior femur was cut, along with the chamfer cuts.  All posterior osteophytes were removed.  The flexion gap was then measured and was symmetric with the extension gap.  I completed the distal femoral preparation using the appropriate jig to prepare the box. It was somewhat challenging to achieve flush fit of the trial, and I had  to go back multiple times to resect more posterior distal bone from the femur in order to minimize cortical prevention of complete seating.  The patella was then measured, and cut with the saw.  The thickness before the cut was 18 and after the cut was  15. I had to cut the patella freehand, which was fairly challenging given the extremely small size of the patella.  The proximal tibia sized and prepared accordingly with the reamer and the punch, and then all components were trialed with the 6 poly insert.  The knee was found to have excellent balance and full motion.    The above named components were then cemented into place and all excess cement was removed.  The real polyethylene implant was placed.  After the cement had cured I released the tourniquet and confirmed excellent hemostasis with no major posterior vessel injury.    The knee was easily taken through a range of motion and the patella tracked well and the knee irrigated copiously and the parapatellar and subcutaneous tissue closed with vicryl, and monocryl with steri strips for the skin.  The wounds were injected with marcaine, and dressed with sterile gauze and the patient was awakened and returned to the PACU in stable and satisfactory condition.  There were no complications.  Total tourniquet time was ~110 minutes.

## 2016-02-24 NOTE — H&P (Signed)
PREOPERATIVE H&P  Chief Complaint: DJD RIGHT KNEE  HPI: Kathy Young is a 70 y.o. female who presents for preoperative history and physical with a diagnosis of DJD RIGHT KNEE. Symptoms are rated as moderate to severe, and have been worsening.  This is significantly impairing activities of daily living.  She has elected for surgical management.   She has failed injections, activity modification, anti-inflammatories, and assistive devices.  Preoperative X-rays demonstrate end stage degenerative changes with osteophyte formation, loss of joint space, subchondral sclerosis.  She has morbid obesity and has worked for years try and achieve weight loss. She's had both shoulders replaced with excellent results and is miserable with the knee.   Past Medical History  Diagnosis Date  . Diabetes mellitus without complication (HCC)   . Obesity   . Hypertension   . Hypothyroidism   . Vitamin D deficiency   . OSA (obstructive sleep apnea)   . Lymphedema   . Trigeminal neuralgia   . Arthritis   . Depression   . Anemia   . SOB (shortness of breath)   . Reflux   . Hypercholesteremia   . Noncompliance with CPAP treatment   . Pneumonia   . Anxiety   . Seizures (HCC)     not for 20 years. does not see a neurologist  . Urinary incontinence    Past Surgical History  Procedure Laterality Date  . Cholecystectomy    . Shoulder surgery    . Carpal tunnel release    . Ovarian cyst removal    . Tonsillectomy    . Appendectomy    . Colonoscopy    . Tubal ligation     Social History   Social History  . Marital Status: Divorced    Spouse Name: N/A  . Number of Children: N/A  . Years of Education: N/A   Social History Main Topics  . Smoking status: Never Smoker   . Smokeless tobacco: None  . Alcohol Use: No  . Drug Use: No  . Sexual Activity: Not Asked   Other Topics Concern  . None   Social History Narrative   Family History  Problem Relation Age of Onset  . Cancer Mother   .  Hypertension Mother   . Diabetes Father   . Heart disease Father   . Heart attack Father   . Diabetes Sister   . Diabetes Brother    Allergies  Allergen Reactions  . Dilantin [Phenytoin Sodium Extended] Hives and Itching  . Tegretol [Carbamazepine] Hives and Itching  . Latex Itching and Rash   Prior to Admission medications   Medication Sig Start Date End Date Taking? Authorizing Provider  atorvastatin (LIPITOR) 10 MG tablet Take 10 mg by mouth daily at 6 PM.    Yes Historical Provider, MD  cholecalciferol (VITAMIN D) 1000 UNITS tablet Take 1,000 Units by mouth daily.   Yes Historical Provider, MD  diclofenac sodium (VOLTAREN) 1 % GEL Apply 2 g topically as needed (for pain).    Yes Historical Provider, MD  escitalopram (LEXAPRO) 20 MG tablet Take 20 mg by mouth at bedtime.    Yes Historical Provider, MD  gabapentin (NEURONTIN) 400 MG capsule Take 400 mg by mouth 2 (two) times daily.   Yes Historical Provider, MD  levothyroxine (SYNTHROID, LEVOTHROID) 200 MCG tablet Take 200 mcg by mouth daily before breakfast. **brand name only**   Yes Historical Provider, MD  lisinopril (PRINIVIL,ZESTRIL) 20 MG tablet Take 20 mg by mouth daily.   Yes  Historical Provider, MD  metFORMIN (GLUCOPHAGE-XR) 500 MG 24 hr tablet Take 500 mg by mouth 2 (two) times daily with a meal. 01/16/16  Yes Historical Provider, MD  Multiple Vitamin (MULTIVITAMIN) tablet Take 1 tablet by mouth daily.   Yes Historical Provider, MD  oxybutynin (DITROPAN XL) 15 MG 24 hr tablet Take 15 mg by mouth every morning.    Yes Historical Provider, MD  traMADol (ULTRAM) 50 MG tablet Take 50 mg by mouth every 6 (six) hours as needed (for pain).    Yes Historical Provider, MD  valACYclovir (VALTREX) 500 MG tablet Take 500 mg by mouth daily. 07/28/15  Yes Historical Provider, MD     Positive ROS: All other systems have been reviewed and were otherwise negative with the exception of those mentioned in the HPI and as above.  Physical Exam:   Estimated body mass index is 44.12 kg/(m^2) as calculated from the following:   Height as of 02/12/16: 5' 9.5" (1.765 m).   Weight as of this encounter: 137.44 kg (303 lb).  General: Alert, no acute distress Cardiovascular: No pedal edema Respiratory: No cyanosis, no use of accessory musculature GI: No organomegaly, abdomen is soft and non-tender Skin: No lesions in the area of chief complaint Neurologic: Sensation intact distally Psychiatric: Patient is competent for consent with normal mood and affect Lymphatic: No axillary or cervical lymphadenopathy  MUSCULOSKELETAL: Right knee has range of motion from 10 to 90. Positive crepitance and diffuse pain with effusion.  She has chronic bilateral venous stasis in both ankles.  Assessment: DJD RIGHT KNEE   Plan: Plan for Procedure(s): TOTAL KNEE ARTHROPLASTY  The risks benefits and alternatives were discussed with the patient including but not limited to the risks of nonoperative treatment, versus surgical intervention including infection, bleeding, nerve injury,  blood clots, cardiopulmonary complications, morbidity, mortality, among others, and they were willing to proceed.   Eulas Post, MD Cell [phone]   02/24/2016 7:15 AM

## 2016-02-24 NOTE — Progress Notes (Deleted)
Abductor canal block done at bedside by Dr. Hart RochesterHollis. A time off was called prior to the procedure.

## 2016-02-25 ENCOUNTER — Encounter (HOSPITAL_COMMUNITY): Payer: Self-pay | Admitting: Orthopedic Surgery

## 2016-02-25 LAB — BASIC METABOLIC PANEL
Anion gap: 10 (ref 5–15)
BUN: 27 mg/dL — AB (ref 4–21)
BUN: 27 mg/dL — AB (ref 6–20)
CALCIUM: 9.1 mg/dL (ref 8.9–10.3)
CO2: 25 mmol/L (ref 22–32)
CREATININE: 1.1 mg/dL (ref 0.5–1.1)
Chloride: 104 mmol/L (ref 101–111)
Creatinine, Ser: 1.14 mg/dL — ABNORMAL HIGH (ref 0.44–1.00)
GFR calc Af Amer: 56 mL/min — ABNORMAL LOW (ref >60)
GFR, EST NON AFRICAN AMERICAN: 48 mL/min — AB (ref >60)
GLUCOSE: 130 mg/dL — AB (ref 65–99)
Glucose: 130 mg/dL
POTASSIUM: 4.4 mmol/L (ref 3.5–5.1)
Sodium: 139 mmol/L (ref 135–145)
Sodium: 139 mmol/L (ref 137–147)

## 2016-02-25 LAB — CBC
HCT: 30.1 % — ABNORMAL LOW (ref 36.0–46.0)
Hemoglobin: 10 g/dL — ABNORMAL LOW (ref 12.0–15.0)
MCH: 31.9 pg (ref 26.0–34.0)
MCHC: 33.2 g/dL (ref 30.0–36.0)
MCV: 96.2 fL (ref 78.0–100.0)
PLATELETS: 138 10*3/uL — AB (ref 150–400)
RBC: 3.13 MIL/uL — ABNORMAL LOW (ref 3.87–5.11)
RDW: 13.1 % (ref 11.5–15.5)
WBC: 7.9 10*3/uL (ref 4.0–10.5)

## 2016-02-25 LAB — GLUCOSE, CAPILLARY
Glucose-Capillary: 121 mg/dL — ABNORMAL HIGH (ref 65–99)
Glucose-Capillary: 144 mg/dL — ABNORMAL HIGH (ref 65–99)
Glucose-Capillary: 189 mg/dL — ABNORMAL HIGH (ref 65–99)
Glucose-Capillary: 125 mg/dL — ABNORMAL HIGH (ref 65–99)

## 2016-02-25 NOTE — Clinical Social Work Note (Signed)
Clinical Social Work Assessment  Patient Details  Name: Kathy Young MRN: [id number] Date of Birth: 06/07/1946  Date of referral:  02/25/16               Reason for consult:  Facility Placement                Permission sought to share information with:  Facility Medical sales representativeContact Representative, Family Supports Permission granted to share information::  Yes, Verbal Permission Granted  Name::     Deno LungerHall, David Son [phone]  Agency::  SNF admissions  Relationship::     Contact Information:     Housing/Transportation Living arrangements for the past 2 months:  Single Family Home Source of Information:  Patient Patient Interpreter Needed:  None Criminal Activity/Legal Involvement Pertinent to Current Situation/Hospitalization:  No - Comment as needed Significant Relationships:  Adult Children Lives with:  Self Do you feel safe going back to the place where you live?  No (Patient feels she needs some short term rehab before she is able to return back home.) Need for family participation in patient care:  No (Coment)  Care giving concerns:  Patient lives alone and needs some rehab before she can go home.   Social Worker assessment / plan:  Patient is a 70 year old female who lives alone and is pre[facility] at [facility].  Patient is alert and oriented x4 and able to make her own decisions.  Patient states she has never been to rehab before at a SNF, CSW explained to patient what to expect and what the role of the CSW is for patient's discharge.  CSW explained to patient what to expect at SNF for short term rehab and also explained to patient how the SNF will help with her discharge planning back home.  Patient did not express any other concerns or issues, patient just stated the therapy really wore her out.  CSW explained to patient what to expect day of discharge from hospital, and what to expect once she is ready for discharge.  Patient stated she understood and did not have any other  questions.  Employment status:  Retired Health and safety inspectornsurance information:  Medicare PT Recommendations:  Skilled Nursing Facility Information / Referral to community resources:  Skilled Nursing Facility  Patient/Family's Response to care:  Patient in agreement to going to SNF for short term rehab.  Patient/Family's Understanding of and Emotional Response to Diagnosis, Current Treatment, and Prognosis:  Patient aware of current diagnosis and current treatment plan.  Emotional Assessment Appearance:    Attitude/Demeanor/Rapport:    Affect (typically observed):  Calm, Appropriate, Pleasant Orientation:  Oriented to Self, Oriented to Place, Oriented to  Time, Oriented to Situation Alcohol / Substance use:  Not Applicable Psych involvement (Current and /or in the community):  No (Comment)  Discharge Needs  Concerns to be addressed:  No discharge needs identified Readmission within the last 30 days:  No Current discharge risk:  None Barriers to Discharge:  No Barriers Identified   Darleene Cleavernterhaus, Nahome Bublitz R, LCSWA 02/25/2016, 5:39 PM

## 2016-02-25 NOTE — Clinical Social Work Placement (Signed)
   CLINICAL SOCIAL WORK PLACEMENT  NOTE  Date:  02/25/2016  Patient Details  Name: Kathy Young MRN: [id number] Date of Birth: 04/05/1946  Clinical Social Work is seeking post-discharge placement for this patient at the Skilled  Nursing Facility level of care (*CSW will initial, date and re-position this form in  chart as items are completed):  Yes   Patient/family provided with [facility] [place] Clinical Social Work Department's list of facilities offering this level of care within the geographic area requested by the patient (or if unable, by the patient's family).  Yes   Patient/family informed of their freedom to choose among providers that offer the needed level of care, that participate in Medicare, Medicaid or managed care program needed by the patient, have an available bed and are willing to accept the patient.  Yes   Patient/family informed of [facility] ownership interest in [facility] and [facility], as well as of the fact that they are under no obligation to receive care at these facilities.  PASRR submitted to EDS on 02/25/16     PASRR number received on 02/25/16     Existing PASRR number confirmed on       FL2 transmitted to all facilities in geographic area requested by pt/family on 02/25/16     FL2 transmitted to all facilities within larger geographic area on       Patient informed that his/her managed care company has contracts with or will negotiate with certain facilities, including the following:            Patient/family informed of bed offers received.  Patient chooses bed at       Physician recommends and patient chooses bed at      Patient to be transferred to   on  .  Patient to be transferred to facility by       Patient family notified on   of transfer.  Name of family member notified:        PHYSICIAN Please sign FL2     Additional Comment:    _______________________________________________ Darleene CleaverAnterhaus, Deveon Kisiel R, LCSWA 02/25/2016, 5:44  PM

## 2016-02-25 NOTE — Progress Notes (Signed)
Patient ID: Kathy Young, female   DOB: 05/02/1946, 70 y.o.   MRN: [id number]     Subjective:  Patient reports pain as mild.  Patient in bed and in no acute distress.  Denies any CP or SOB  Objective:   VITALS:   Filed Vitals:   02/24/16 1458 02/24/16 2039 02/25/16 0122 02/25/16 0651  BP: 103/60 115/45 110/60 116/62  Pulse: 101 100 84 96  Temp: 99 F (37.2 C) 99.4 F (37.4 C) 99.1 F (37.3 C) 99.5 F (37.5 C)  TempSrc: Oral  Oral Oral  Resp: 14 14 15 16   Weight:      SpO2: 97% 97% 100% 95%    ABD soft Sensation intact distally Dorsiflexion/Plantar flexion intact Incision: dressing C/D/I and no drainage Good foot and ankle function  Lab Results  Component Value Date   WBC 7.9 02/25/2016   HGB 10.0* 02/25/2016   HCT 30.1* 02/25/2016   MCV 96.2 02/25/2016   PLT 138* 02/25/2016   BMET    Component Value Date/Time   NA 139 02/25/2016 0444   K 4.4 02/25/2016 0444   CL 104 02/25/2016 0444   CO2 25 02/25/2016 0444   GLUCOSE 130* 02/25/2016 0444   BUN 27* 02/25/2016 0444   CREATININE 1.14* 02/25/2016 0444   CALCIUM 9.1 02/25/2016 0444   GFRNONAA 48* 02/25/2016 0444   GFRAA 56* 02/25/2016 0444     Assessment/Plan: 1 Day Post-Op   Principal Problem:   Primary localized osteoarthritis of right knee Active Problems:   Severe obesity (BMI >= 40) (HCC)   Advance diet Up with therapy WBAT Dry dressing PRN Plan for SNF Thurs. Or [facility]Fri   DOUGLAS PARRY, Apolinar JunesBRANDON 02/25/2016, 7:27 AM  Seen and agree,    Teryl LucyJoshua Emilina Smarr, MD Cell [phone]

## 2016-02-25 NOTE — Progress Notes (Signed)
Occupational Therapy Evaluation Patient Details Name: Kathy Young MRN: [id number] DOB: 05/28/1946 Today's Date: 02/25/2016    History of Present Illness Pt presents for right TKA. PMH includes obesity, anxiety and depression, OA, trigeminal neuralgia, lymphedema   Clinical Impression   PTA, pt was independent with ADLs and functional mobility. Pt currently requires max assist for LB ADLs and min-mod assist for functional transfers. Began education on knee precautions, home safety, energy conservation, and compensatory strategies for ADLs. Recommending SNF for post-acute rehab stay due to lack of assistance for pt upon discharge. Will continue to follow acutely to address OT needs and goals.    Follow Up Recommendations  SNF;Supervision/Assistance - 24 hour    Equipment Recommendations  Other (comment) (TBD in next venue)    Recommendations for Other Services       Precautions / Restrictions Precautions Precautions: Knee Precaution Booklet Issued: No Precaution Comments: Reviewed not placing pillow, ice pack, or other object under R knee Required Braces or Orthoses: Knee Immobilizer - Right Knee Immobilizer - Right: On when out of bed or walking Restrictions Weight Bearing Restrictions: Yes RLE Weight Bearing: Weight bearing as tolerated      Mobility Bed Mobility Overal bed mobility: Needs Assistance Bed Mobility: Sit to Supine     Supine to sit: Min assist Sit to supine: Min assist   General bed mobility comments: HOB flat, use of bedrails. Min assist to advance RLE onto bed and to re-position in the bed.  Transfers Overall transfer level: Needs assistance Equipment used: Rolling walker (2 wheeled) Transfers: Sit to/from [employer] CorporationStand;Stand Pivot Transfers Sit to Stand: Mod assist Stand pivot transfers: Min assist       General transfer comment: x2 unsuccessful attempts to stand, 3rd attempt successful with mod assist for boost to stand and to maintain balance upon  standing. Pt had LOB psoteriorly and required min assist to regain balance. Verbal cues for proper hand placement on seated surface.    Balance Overall balance assessment: Needs assistance Sitting-balance support: No upper extremity supported;Feet supported Sitting balance-Leahy Scale: Fair Sitting balance - Comments: due to pain and body habitus   Standing balance support: Bilateral upper extremity supported;During functional activity Standing balance-Leahy Scale: Poor Standing balance comment: LOB x1 posteriorly upon standing                             ADL Overall ADL's : Needs assistance/impaired         Upper Body Bathing: Set up;Sitting   Lower Body Bathing: Maximal assistance;Sit to/from stand Lower Body Bathing Details (indicate cue type and reason): Unable to reach feet Upper Body Dressing : Set up;Sitting   Lower Body Dressing: Maximal assistance;Sit to/from stand Lower Body Dressing Details (indicate cue type and reason): Unable to reach feet Toilet Transfer: Minimal assistance;Cueing for safety;Ambulation;BSC;RW   Toileting- Clothing Manipulation and Hygiene: Maximal assistance;Sit to/from stand Toileting - Clothing Manipulation Details (indicate cue type and reason): to move clothing and for pericare     Functional mobility during ADLs: Moderate assistance;Rolling walker General ADL Comments: Began education on knee precautions and compensatory strategies for ADLs.     Vision Vision Assessment?: No apparent visual deficits   Perception     Praxis      Pertinent Vitals/Pain Pain Assessment: 0-10 Pain Score: 6  Pain Location: R knee Pain Descriptors / Indicators: Aching;Sore Pain Intervention(s): Limited activity within patient's tolerance;Monitored during session;Repositioned;RN gave pain meds during session;Ice applied  Hand Dominance Right   Extremity/Trunk Assessment Upper Extremity Assessment Upper Extremity Assessment: Overall WFL  for tasks assessed   Lower Extremity Assessment Lower Extremity Assessment: RLE deficits/detail RLE Deficits / Details: decreased ROM and strength as expected post op RLE: Unable to fully assess due to pain   Cervical / Trunk Assessment Cervical / Trunk Assessment: Other exceptions Cervical / Trunk Exceptions: morbid obesit   Communication Communication Communication: No difficulties   Cognition Arousal/Alertness: Awake/alert Behavior During Therapy: WFL for tasks assessed/performed Overall Cognitive Status: Within Functional Limits for tasks assessed                     General Comments       Exercises Exercises: Total Joint     Shoulder Instructions      Home Living Family/patient expects to be discharged to:: Skilled nursing facility Living Arrangements: Alone Available Help at Discharge: Family;Available PRN/intermittently               Bathroom Shower/Tub: Tub/shower unit;Curtain Shower/tub characteristics: Engineer, building services: Standard     Home Equipment: Environmental consultant - 2 wheels;Cane - single point;Tub bench;Bedside commode   Additional Comments: going to SNF because lives alone.      Prior Functioning/Environment Level of Independence: Independent             OT Diagnosis: Generalized weakness;Acute pain   OT Problem List: Decreased strength;Decreased range of motion;Decreased activity tolerance;Impaired balance (sitting and/or standing);Decreased coordination;Decreased safety awareness;Decreased knowledge of precautions;Decreased knowledge of use of DME or AE;Obesity;Pain   OT Treatment/Interventions: Therapeutic exercise;Self-care/ADL training;DME and/or AE instruction;Energy conservation;Cognitive remediation/compensation;Patient/family education;Balance training    OT Goals(Current goals can be found in the care plan section) Acute Rehab OT Goals Patient Stated Goal: return home after rehab OT Goal Formulation: With patient Time For  Goal Achievement: 03/10/16 Potential to Achieve Goals: Good ADL Goals Pt Will Perform Grooming: with min guard assist;standing Pt Will Perform Lower Body Bathing: with min guard assist;with adaptive equipment;sitting/lateral leans;sit to/from stand Pt Will Perform Lower Body Dressing: with min guard assist;with adaptive equipment;sitting/lateral leans;sit to/from stand Pt Will Transfer to Toilet: with min guard assist;ambulating;bedside commode (with BSC over toilet) Pt Will Perform Toileting - Clothing Manipulation and hygiene: with min guard assist;sitting/lateral leans;sit to/from stand  OT Frequency: Min 2X/week   Barriers to D/C: Decreased caregiver support  Lives alone and family only available intermittently       Co-evaluation              End of Session Equipment Utilized During Treatment: Gait belt;Rolling walker Nurse Communication: Mobility status  Activity Tolerance: Patient limited by pain Patient left: with call bell/phone within reach;in bed;with SCD's reapplied   Time: 1453-1516 OT Time Calculation (min): 23 min Charges:  OT General Charges $OT Visit: 1 Procedure OT Evaluation $OT Eval Moderate Complexity: 1 Procedure OT Treatments $Self Care/Home Management : 8-22 mins G-Codes:    Nils Pyle, OTR/L Pager: [phone] 02/25/2016, 3:29 PM

## 2016-02-25 NOTE — Progress Notes (Signed)
Physical Therapy Treatment Patient Details Name: Kathy Young MRN: [id number] DOB: 07/02/1946 Today's Date: 02/25/2016    History of Present Illness Pt presents for right TKA. PMH includes obesity, anxiety and depression, OA, trigeminal neuralgia, lymphedema    PT Comments    Pt performed increased gait distance requires max cues for motivation to advance gait distance.    Follow Up Recommendations  SNF     Equipment Recommendations  None recommended by PT    Recommendations for Other Services       Precautions / Restrictions Precautions Precautions: Knee Precaution Booklet Issued: No Precaution Comments: reviewed proper positioning. Pt tends to rest in external rotation at hip so given education on neutral alignment to keep knee extended.  Propped pt in chair to maintain neutral position.   Required Braces or Orthoses: Knee Immobilizer - Right Knee Immobilizer - Right: On when out of bed or walking Restrictions Weight Bearing Restrictions: Yes RLE Weight Bearing: Weight bearing as tolerated    Mobility  Bed Mobility Overal bed mobility: Needs Assistance Bed Mobility: Supine to Sit     Supine to sit: Min assist     General bed mobility comments: Pt required min assist to advance R LE to edge of bed.    Transfers Overall transfer level: Needs assistance Equipment used: Rolling walker (2 wheeled) Transfers: Sit to/from Stand Sit to Stand: Min guard         General transfer comment: Pt required cues for hand and foot placement to improve ease and decrease pain, STS from EOB and BSC.  Cues to advance RLE forward during descent, demonstrated poor eccentric loading.    Ambulation/Gait Ambulation/Gait assistance: Min assist Ambulation Distance (Feet): 22 Feet Assistive device: Rolling walker (2 wheeled) Gait Pattern/deviations: Step-to pattern;Wide base of support;Antalgic;Decreased stance time - right;Decreased step length - left Gait velocity: decreased Gait  velocity interpretation: Below normal speed for age/gender General Gait Details: RLE ER during stance phase on RLE.  Pt required cues for upper trunk control and gait symmetry during step to pattern.     Stairs            Wheelchair Mobility    Modified Rankin (Stroke Patients Only)       Balance Overall balance assessment: Needs assistance Sitting-balance support: No upper extremity supported Sitting balance-Leahy Scale: Good     Standing balance support: Bilateral upper extremity supported Standing balance-Leahy Scale: Poor                      Cognition Arousal/Alertness: Awake/alert Behavior During Therapy: WFL for tasks assessed/performed Overall Cognitive Status: Within Functional Limits for tasks assessed                      Exercises Total Joint Exercises Ankle Circles/Pumps: AROM;10 reps;Seated;Both Quad Sets: AROM;Seated;10 reps;Both Gluteal Sets: AROM;Both;10 reps Heel Slides: AAROM;Right;10 reps Hip ABduction/ADduction: AAROM;Right;10 reps Straight Leg Raises: AAROM;Right;10 reps Long Arc Quad: AROM;Right;10 reps;Seated Goniometric ROM: 10-85    General Comments        Pertinent Vitals/Pain Pain Assessment: 0-10 Pain Score: 7  Pain Location: R knee Pain Descriptors / Indicators: Aching Pain Intervention(s): Limited activity within patient's tolerance;Monitored during session;Repositioned    Home Living Family/patient expects to be discharged to:: Skilled nursing facility               Additional Comments: going to SNF because lives alone    Prior Function Level of Independence: Independent  PT Goals (current goals can now be found in the care plan section) Acute Rehab PT Goals Patient Stated Goal: return home after rehab PT Goal Formulation: With patient Time For Goal Achievement: 03/03/16 Potential to Achieve Goals: Good Progress towards PT goals: Progressing toward goals    Frequency  7X/week     PT Plan      Co-evaluation             End of Session Equipment Utilized During Treatment: Gait belt;Right knee immobilizer Activity Tolerance: Patient tolerated treatment well Patient left: in chair;with call bell/phone within reach     Time: 1312-1346 PT Time Calculation (min) (ACUTE ONLY): 34 min  Charges:  $Gait Training: 8-22 mins $Therapeutic Exercise: 8-22 mins                    G Codes:      Florestine Avers 03-20-2016, 2:01 PM  Joycelyn Rua, PTA pager [phone]

## 2016-02-25 NOTE — Evaluation (Signed)
Physical Therapy Evaluation Patient Details Name: Kathy Young MRN: [id number] DOB: 03/08/1946 Today's Date: 02/25/2016   History of Present Illness  Pt presents for right TKA. PMH includes obesity, anxiety and depression, OA, trigeminal neuralgia, lymphedema  Clinical Impression  Pt is s/p TKA resulting in the deficits listed below (see PT Problem List). Pt ambulated 15' with min A and RW.  Pt will benefit from skilled PT to increase their independence and safety with mobility to allow discharge to the venue listed below.      Follow Up Recommendations SNF    Equipment Recommendations  None recommended by PT    Recommendations for Other Services       Precautions / Restrictions Precautions Precautions: Knee Precaution Booklet Issued: No Precaution Comments: reviewed proper positioning. Pt tends to rest in external rotation at hip so given education on neutral alignment to keep knee extended  Required Braces or Orthoses: Knee Immobilizer - Right Knee Immobilizer - Right: On when out of bed or walking Restrictions Weight Bearing Restrictions: Yes RLE Weight Bearing: Weight bearing as tolerated      Mobility  Bed Mobility Overal bed mobility: Needs Assistance Bed Mobility: Supine to Sit     Supine to sit: Min assist     General bed mobility comments: vc's for sequencing and rail used, min A to get RLE off bed but pt able to get self to sitting after that  Transfers Overall transfer level: Needs assistance Equipment used: Rolling walker (2 wheeled) Transfers: Sit to/from Stand Sit to Stand: Min assist;+2 safety/equipment         General transfer comment: pt had difficulty getting into good position at EOB but once scooted out, was able to stand with min A and vc's to push through LLE. Min A to control stand to sit  Ambulation/Gait Ambulation/Gait assistance: Min assist;+2 safety/equipment Ambulation Distance (Feet): 15 Feet Assistive device: Rolling walker (2  wheeled) Gait Pattern/deviations: Step-to pattern Gait velocity: decreased Gait velocity interpretation: Below normal speed for age/gender General Gait Details: pt's distance limited by fatigue, specifically in UE's from increased WB'ing on RW. vc's for sequencing. No right knee buckling noted within [employer]is[employer]KI  Stairs            Wheelchair Mobility    Modified Rankin (Stroke Patients Only)       Balance Overall balance assessment: Needs assistance Sitting-balance support: No upper extremity supported Sitting balance-Leahy Scale: Good     Standing balance support: Bilateral upper extremity supported Standing balance-Leahy Scale: Poor                               Pertinent Vitals/Pain Pain Assessment: 0-10 Pain Score: 3  Pain Location: right knee Pain Descriptors / Indicators: Aching Pain Intervention(s): Limited activity within patient's tolerance;Monitored during session;Premedicated before session;Repositioned    Home Living Family/patient expects to be discharged to:: Skilled nursing facility                 Additional Comments: going to SNF because lives alone    Prior Function Level of Independence: Independent               Hand Dominance        Extremity/Trunk Assessment   Upper Extremity Assessment: Defer to OT evaluation;Generalized weakness           Lower Extremity Assessment: RLE deficits/detail;Generalized weakness RLE Deficits / Details: quad 2/5, hip flex 2/5, ankle [facility]FL  Cervical / Trunk Assessment: Normal  Communication   Communication: No difficulties  Cognition Arousal/Alertness: Awake/alert Behavior During Therapy: WFL for tasks assessed/performed Overall Cognitive Status: Within Functional Limits for tasks assessed                      General Comments      Exercises Total Joint Exercises Ankle Circles/Pumps: AROM;Both;20 reps;Seated Quad Sets: AROM;Both;10 reps;Seated Heel Slides:  AAROM;Right;10 reps;Seated Long Arc Quad: AROM;Right;10 reps;Seated Goniometric ROM: 10-85      Assessment/Plan    PT Assessment Patient needs continued PT services  PT Diagnosis Difficulty walking;Abnormality of gait;Acute pain   PT Problem List Decreased strength;Decreased range of motion;Decreased activity tolerance;Decreased balance;Decreased mobility;Decreased knowledge of use of DME;Decreased knowledge of precautions;Pain;Obesity  PT Treatment Interventions DME instruction;Gait training;Functional mobility training;Therapeutic activities;Therapeutic exercise;Balance training;Patient/family education;Neuromuscular re-education   PT Goals (Current goals can be found in the Care Plan section) Acute Rehab PT Goals Patient Stated Goal: return home after rehab PT Goal Formulation: With patient Time For Goal Achievement: 03/03/16 Potential to Achieve Goals: Good    Frequency 7X/week   Barriers to discharge Decreased caregiver support SNF at d/c    Co-evaluation               End of Session Equipment Utilized During Treatment: Gait belt;Right knee immobilizer Activity Tolerance: Patient tolerated treatment well Patient left: in chair;with call bell/phone within reach Nurse Communication: Mobility status         Time: 1610-9604 PT Time Calculation (min) (ACUTE ONLY): 26 min   Charges:   PT Evaluation $PT Eval Moderate Complexity: 1 Procedure PT Treatments $Gait Training: 8-22 mins   PT G Codes:       Lyanne Co, PT  Acute Rehab Services  [phone]  Lyanne Co 02/25/2016, 12:27 PM

## 2016-02-25 NOTE — Care Management Note (Signed)
Case Management Note  Patient Details  Name: Hadassah PaisCandace E Swader MRN: [id number] Date of Birth: 08/27/1946  Subjective/Objective:            S/p right total knee arthroplasty        Action/Plan: PT recommending SNF. Referral made to CSW for SNF placement for short term rehab. Will continue to follow.   Expected Discharge Date:                  Expected Discharge Plan:  Skilled Nursing Facility  In-House Referral:  Clinical Social Work  Discharge planning Services  CM Consult  Post Acute Care Choice:  NA Choice offered to:     DME Arranged:    DME Agency:     HH Arranged:    HH Agency:     Status of Service:  In process, will continue to follow  Medicare Important Message Given:    Date Medicare IM Given:    Medicare IM give by:    Date Additional Medicare IM Given:    Additional Medicare Important Message give by:     If discussed at Long Length of Stay Meetings, dates discussed:    Additional Comments:  Monica BectonKrieg, Yaniel Limbaugh Watson, RN 02/25/2016, 2:34 PM

## 2016-02-25 NOTE — [place] FL2 (Signed)
[place] MEDICAID FL2 LEVEL OF CARE SCREENING TOOL     IDENTIFICATION  Patient Name: Kathy Young Birthdate: 12-18-1946 Sex: female Admission Date (Current Location): 02/24/2016  [facility] and [place] Number:  Producer, television/film/video and Address:  [facility], [address],  [place], [place], [address]       Provider Number: [id number]  Attending Physician Name and Address:  Teryl Lucy, MD  Relative Name and Phone Number:  Laurann, Mcmorris [phone]    Current Level of Care: Hospital Recommended Level of Care: Skilled Nursing Facility Prior Approval Number:    Date Approved/Denied:   PASRR Number: [id number]  Discharge Plan: SNF    Current Diagnoses: Patient Active Problem List   Diagnosis Date Noted  . Primary localized osteoarthritis of right knee 02/24/2016  . Severe obesity (BMI >= 40) (HCC) 02/24/2016    Orientation RESPIRATION BLADDER Height & Weight     Self, Time, Situation, Place  Normal Continent Weight: (!) 303 lb (137.44 kg) Height:     BEHAVIORAL SYMPTOMS/MOOD NEUROLOGICAL BOWEL NUTRITION STATUS      Continent    AMBULATORY STATUS COMMUNICATION OF NEEDS Skin   Limited Assist Verbally Surgical wounds                       Personal Care Assistance Level of Assistance  Bathing, Dressing Bathing Assistance: Limited assistance   Dressing Assistance: Limited assistance     Functional Limitations Info             SPECIAL CARE FACTORS FREQUENCY  PT (By licensed PT)     PT Frequency: 5x a week              Contractures      Additional Factors Info  Code Status, Insulin Sliding Scale     Allergies Code Status Info: Full     Insulin Sliding Scale Info: 3x a day     DILANTIN, TEGRETOL, LATEX   Current Medications (02/25/2016):  This is the current hospital active medication list Current Facility-Administered Medications  Medication Dose Route Frequency Provider Last Rate Last Dose  . 0.45 % NaCl with KCl 20 mEq /  L infusion   Intravenous Continuous Teryl Lucy, MD      . acetaminophen (TYLENOL) tablet 650 mg  650 mg Oral Q6H PRN Teryl Lucy, MD       Or  . acetaminophen (TYLENOL) suppository 650 mg  650 mg Rectal Q6H PRN Teryl Lucy, MD      . alum & mag hydroxide-simeth (MAALOX/MYLANTA) 200-200-20 MG/5ML suspension 30 mL  30 mL Oral Q4H PRN Teryl Lucy, MD      . atorvastatin (LIPITOR) tablet 10 mg  10 mg Oral q1800 Teryl Lucy, MD   10 mg at 02/25/16 1729  . bisacodyl (DULCOLAX) suppository 10 mg  10 mg Rectal Daily PRN Teryl Lucy, MD      . cholecalciferol (VITAMIN D) tablet 1,000 Units  1,000 Units Oral Daily Teryl Lucy, MD   1,000 Units at 02/25/16 [phone]  . diphenhydrAMINE (BENADRYL) 12.5 MG/5ML elixir 12.5-25 mg  12.5-25 mg Oral Q4H PRN Teryl Lucy, MD      . docusate sodium (COLACE) capsule 100 mg  100 mg Oral BID Teryl Lucy, MD   100 mg at 02/25/16 0917  . escitalopram (LEXAPRO) tablet 20 mg  20 mg Oral QHS Teryl Lucy, MD   20 mg at 02/24/16 2151  . gabapentin (NEURONTIN) capsule 400 mg  400 mg  Oral BID Teryl LucyJoshua Landau, MD   400 mg at 02/25/16 0919  . HYDROmorphone (DILAUDID) injection 1 mg  1 mg Intravenous Q2H PRN Teryl LucyJoshua Landau, MD      . Influenza vac split quadrivalent PF (FLUARIX) injection 0.5 mL  0.5 mL Intramuscular Tomorrow-1000 Teryl LucyJoshua Landau, MD      . insulin aspart (novoLOG) injection 0-15 Units  0-15 Units Subcutaneous TID WC Teryl LucyJoshua Landau, MD   3 Units at 02/25/16 1728  . levothyroxine (SYNTHROID, LEVOTHROID) tablet 200 mcg  200 mcg Oral QAC breakfast Teryl LucyJoshua Landau, MD   200 mcg at 02/25/16 0920  . lisinopril (PRINIVIL,ZESTRIL) tablet 20 mg  20 mg Oral Daily Teryl LucyJoshua Landau, MD   20 mg at 02/25/16 0920  . magnesium citrate solution 1 Bottle  1 Bottle Oral Once PRN Teryl LucyJoshua Landau, MD      . menthol-cetylpyridinium (CEPACOL) lozenge 3 mg  1 lozenge Oral PRN Teryl LucyJoshua Landau, MD       Or  . phenol (CHLORASEPTIC) mouth spray 1 spray  1 spray Mouth/Throat PRN Teryl LucyJoshua Landau,  MD      . methocarbamol (ROBAXIN) tablet 500 mg  500 mg Oral Q6H PRN Teryl LucyJoshua Landau, MD   500 mg at 02/25/16 1509   Or  . methocarbamol (ROBAXIN) 500 mg in dextrose 5 % 50 mL IVPB  500 mg Intravenous Q6H PRN Teryl LucyJoshua Landau, MD      . metoCLOPramide (REGLAN) tablet 5-10 mg  5-10 mg Oral Q8H PRN Teryl LucyJoshua Landau, MD       Or  . metoCLOPramide (REGLAN) injection 5-10 mg  5-10 mg Intravenous Q8H PRN Teryl LucyJoshua Landau, MD      . multivitamin with minerals tablet 1 tablet  1 tablet Oral Daily Teryl LucyJoshua Landau, MD   1 tablet at 02/25/16 0919  . ondansetron (ZOFRAN) tablet 4 mg  4 mg Oral Q6H PRN Teryl LucyJoshua Landau, MD       Or  . ondansetron [facility](ZOFRAN) injection 4 mg  4 mg Intravenous Q6H PRN Teryl LucyJoshua Landau, MD      . oxybutynin (DITROPAN XL) 24 hr tablet 15 mg  15 mg Oral q morning - 10a Teryl LucyJoshua Landau, MD   15 mg at 02/25/16 [id number]  . oxyCODONE (Oxy IR/ROXICODONE) immediate release tablet 5-10 mg  5-10 mg Oral Q3H PRN Teryl LucyJoshua Landau, MD   10 mg at 02/25/16 1509  . polyethylene glycol (MIRALAX / GLYCOLAX) packet 17 g  17 g Oral Daily PRN Teryl LucyJoshua Landau, MD      . rivaroxaban Carlena Hurl(XARELTO) tablet 10 mg  10 mg Oral Q breakfast Teryl LucyJoshua Landau, MD   10 mg at 02/25/16 [id number]  . senna (SENOKOT) tablet 8.6 mg  1 tablet Oral BID Teryl LucyJoshua Landau, MD   8.6 mg at 02/25/16 0919  . traMADol (ULTRAM) tablet 50 mg  50 mg Oral Q6H PRN Teryl LucyJoshua Landau, MD   50 mg at 02/25/16 0900  . valACYclovir (VALTREX) tablet 500 mg  500 mg Oral Daily Teryl LucyJoshua Landau, MD   500 mg at 02/25/16 [id number]     Discharge Medications: Please see discharge summary for a list of discharge medications.  Relevant Imaging Results:  Relevant Lab Results:   Additional Information SSN [id number]  Darleene Cleavernterhaus, Fergie Sherbert R, [place]

## 2016-02-26 DIAGNOSIS — M6281 Muscle weakness (generalized): Secondary | ICD-10-CM | POA: Diagnosis not present

## 2016-02-26 DIAGNOSIS — N289 Disorder of kidney and ureter, unspecified: Secondary | ICD-10-CM | POA: Diagnosis not present

## 2016-02-26 DIAGNOSIS — K5900 Constipation, unspecified: Secondary | ICD-10-CM | POA: Diagnosis not present

## 2016-02-26 DIAGNOSIS — M25559 Pain in unspecified hip: Secondary | ICD-10-CM | POA: Diagnosis not present

## 2016-02-26 DIAGNOSIS — Z96651 Presence of right artificial knee joint: Secondary | ICD-10-CM | POA: Diagnosis not present

## 2016-02-26 DIAGNOSIS — E785 Hyperlipidemia, unspecified: Secondary | ICD-10-CM | POA: Diagnosis not present

## 2016-02-26 DIAGNOSIS — E039 Hypothyroidism, unspecified: Secondary | ICD-10-CM | POA: Diagnosis not present

## 2016-02-26 DIAGNOSIS — N3281 Overactive bladder: Secondary | ICD-10-CM | POA: Diagnosis not present

## 2016-02-26 DIAGNOSIS — M25561 Pain in right knee: Secondary | ICD-10-CM | POA: Diagnosis not present

## 2016-02-26 DIAGNOSIS — I10 Essential (primary) hypertension: Secondary | ICD-10-CM | POA: Diagnosis not present

## 2016-02-26 DIAGNOSIS — Z6841 Body Mass Index (BMI) 40.0 and over, adult: Secondary | ICD-10-CM | POA: Diagnosis not present

## 2016-02-26 DIAGNOSIS — R2681 Unsteadiness on feet: Secondary | ICD-10-CM | POA: Diagnosis not present

## 2016-02-26 DIAGNOSIS — E1149 Type 2 diabetes mellitus with other diabetic neurological complication: Secondary | ICD-10-CM | POA: Diagnosis not present

## 2016-02-26 DIAGNOSIS — D62 Acute posthemorrhagic anemia: Secondary | ICD-10-CM | POA: Diagnosis not present

## 2016-02-26 DIAGNOSIS — R278 Other lack of coordination: Secondary | ICD-10-CM | POA: Diagnosis not present

## 2016-02-26 DIAGNOSIS — F329 Major depressive disorder, single episode, unspecified: Secondary | ICD-10-CM | POA: Diagnosis not present

## 2016-02-26 DIAGNOSIS — Z471 Aftercare following joint replacement surgery: Secondary | ICD-10-CM | POA: Diagnosis not present

## 2016-02-26 DIAGNOSIS — M1711 Unilateral primary osteoarthritis, right knee: Secondary | ICD-10-CM | POA: Diagnosis not present

## 2016-02-26 DIAGNOSIS — E559 Vitamin D deficiency, unspecified: Secondary | ICD-10-CM | POA: Diagnosis not present

## 2016-02-26 DIAGNOSIS — M792 Neuralgia and neuritis, unspecified: Secondary | ICD-10-CM | POA: Diagnosis not present

## 2016-02-26 LAB — CBC
HCT: 27.9 % — ABNORMAL LOW (ref 36.0–46.0)
Hemoglobin: 9.6 g/dL — ABNORMAL LOW (ref 12.0–15.0)
MCH: 32.7 pg (ref 26.0–34.0)
MCHC: 34.4 g/dL (ref 30.0–36.0)
MCV: 94.9 fL (ref 78.0–100.0)
PLATELETS: 139 10*3/uL — AB (ref 150–400)
RBC: 2.94 MIL/uL — ABNORMAL LOW (ref 3.87–5.11)
RDW: 12.9 % (ref 11.5–15.5)
WBC: 10.1 10*3/uL (ref 4.0–10.5)

## 2016-02-26 LAB — CBC AND DIFFERENTIAL: WBC: 10.1 10*3/mL

## 2016-02-26 LAB — GLUCOSE, CAPILLARY
Glucose-Capillary: 107 mg/dL — ABNORMAL HIGH (ref 65–99)
Glucose-Capillary: 164 mg/dL — ABNORMAL HIGH (ref 65–99)

## 2016-02-26 NOTE — Discharge Summary (Signed)
Physician Discharge Summary  Patient ID: Kathy Young MRN: [id number] DOB/AGE: 69/05/1946 69 y.o.  Admit date: 02/24/2016 Discharge date: 02/26/2016  Admission Diagnoses:  Primary localized osteoarthritis of right knee  Discharge Diagnoses:  Principal Problem:   Primary localized osteoarthritis of right knee Active Problems:   Severe obesity (BMI >= 40) (HCC)   Past Medical History  Diagnosis Date  . Diabetes mellitus without complication (HCC)   . Obesity   . Hypertension   . Hypothyroidism   . Vitamin D deficiency   . OSA (obstructive sleep apnea)   . Lymphedema   . Trigeminal neuralgia   . Arthritis   . Depression   . Anemia   . SOB (shortness of breath)   . Reflux   . Hypercholesteremia   . Noncompliance with CPAP treatment   . Pneumonia   . Anxiety   . Seizures (HCC)     not for 20 years. does not see a neurologist  . Urinary incontinence   . Primary localized osteoarthritis of right knee 02/24/2016  . Severe obesity (BMI >= 40) (HCC) 02/24/2016    Surgeries: Procedure(s): TOTAL KNEE ARTHROPLASTY on 02/24/2016   Consultants (if any):    Discharged Condition: Improved  Hospital Course: Kathy Young is an 70 y.o. female who was admitted 02/24/2016 with a diagnosis of Primary localized osteoarthritis of right knee and went to the operating room on 02/24/2016 and underwent the above named procedures.    She was given perioperative antibiotics:  Anti-infectives    Start     Dose/Rate Route Frequency Ordered Stop   02/25/16 1000  valACYclovir (VALTREX) tablet 500 mg     500 mg Oral Daily 02/24/16 1442     02/24/16 1600  ceFAZolin (ANCEF) 3 g in dextrose 5 % 50 mL IVPB     3 g 130 mL/hr over 30 Minutes Intravenous Every 6 hours 02/24/16 1441 02/24/16 2221   02/24/16 0715  ceFAZolin (ANCEF) 3 g in dextrose 5 % 50 mL IVPB     3 g 130 mL/hr over 30 Minutes Intravenous On call to O.R. 02/23/16 1408 02/24/16 0745    .  She was given sequential compression devices,  early ambulation, and xarleto for DVT prophylaxis.  She benefited maximally from the hospital stay and there were no complications.    Recent vital signs:  Filed Vitals:   02/25/16 2025 02/26/16 0354  BP: 122/66 134/54  Pulse: 94 65  Temp: 99.7 F (37.6 C) 98.3 F (36.8 C)  Resp: 16 18    Recent laboratory studies:  Lab Results  Component Value Date   HGB 9.6* 02/26/2016   HGB 10.0* 02/25/2016   HGB 12.3 02/12/2016   Lab Results  Component Value Date   WBC 10.1 02/26/2016   PLT 139* 02/26/2016   Lab Results  Component Value Date   INR 1.00 07/28/2011   Lab Results  Component Value Date   NA 139 02/25/2016   K 4.4 02/25/2016   CL 104 02/25/2016   CO2 25 02/25/2016   BUN 27* 02/25/2016   CREATININE 1.14* 02/25/2016   GLUCOSE 130* 02/25/2016    Discharge Medications:     Medication List    TAKE these medications        atorvastatin 10 MG tablet  Commonly known as:  LIPITOR  Take 10 mg by mouth daily at 6 PM.     baclofen 10 MG tablet  Commonly known as:  LIORESAL  Take 1 tablet (10 mg total)  by mouth 3 (three) times daily. As needed for muscle spasm     cholecalciferol 1000 units tablet  Commonly known as:  VITAMIN D  Take 1,000 Units by mouth daily.     diclofenac sodium 1 % Gel  Commonly known as:  VOLTAREN  Apply 2 g topically as needed (for pain).     escitalopram 20 MG tablet  Commonly known as:  LEXAPRO  Take 20 mg by mouth at bedtime.     gabapentin 400 MG capsule  Commonly known as:  NEURONTIN  Take 400 mg by mouth 2 (two) times daily.     levothyroxine 200 MCG tablet  Commonly known as:  SYNTHROID, LEVOTHROID  Take 200 mcg by mouth daily before breakfast. **brand name only**     lisinopril 20 MG tablet  Commonly known as:  PRINIVIL,ZESTRIL  Take 20 mg by mouth daily.     metFORMIN 500 MG 24 hr tablet  Commonly known as:  GLUCOPHAGE-XR  Take 500 mg by mouth 2 (two) times daily with a meal.     multivitamin tablet  Take 1 tablet  by mouth daily.     ondansetron 4 MG tablet  Commonly known as:  ZOFRAN  Take 1 tablet (4 mg total) by mouth every 8 (eight) hours as needed for nausea or vomiting.     oxybutynin 15 MG 24 hr tablet  Commonly known as:  DITROPAN XL  Take 15 mg by mouth every morning.     oxyCODONE-acetaminophen 10-325 MG tablet  Commonly known as:  PERCOCET  Take 1-2 tablets by mouth every 6 (six) hours as needed for pain. MAXIMUM TOTAL ACETAMINOPHEN DOSE IS 4000 MG PER DAY     Rivaroxaban 15 MG Tabs tablet  Commonly known as:  XARELTO  Take 1 tablet (15 mg total) by mouth daily with supper.     sennosides-docusate sodium 8.6-50 MG tablet  Commonly known as:  SENOKOT-S  Take 2 tablets by mouth daily.     traMADol 50 MG tablet  Commonly known as:  ULTRAM  Take 50 mg by mouth every 6 (six) hours as needed (for pain).     valACYclovir 500 MG tablet  Commonly known as:  VALTREX  Take 500 mg by mouth daily.        Diagnostic Studies: Dg Knee Right Port  02/24/2016  CLINICAL DATA:  Status post right knee replacement EXAM: PORTABLE RIGHT KNEE - 1-2 VIEW COMPARISON:  None. FINDINGS: Right knee replacement is noted. Air is noted in the surgical bed. No acute soft tissue or bony abnormality is noted. IMPRESSION: Status post right knee replacement Electronically Signed   By: Alcide Clever M.D.   On: 02/24/2016 12:18    Disposition: 01-Home or Self Care        Follow-up Information    Follow up with Eulas Post, MD. Schedule an appointment as soon as possible for a visit in 2 weeks.   Specialty:  Orthopedic Surgery   Contact information:   [address] [place] [address] [phone]        Signed: Eulas Post 02/26/2016, 10:35 AM

## 2016-02-26 NOTE — Clinical Social Work Note (Signed)
Patient to be d/c'ed today to [facility].  Patient and family agreeable to plans will transport via patient's son RN to call report to [phone].  Windell MouldingEric Belinda Bringhurst, MSW, Theresia MajorsLCSWA [phone]

## 2016-02-26 NOTE — Progress Notes (Signed)
Report called to Misty-RN at [facility]. All questions/concerns addressed. IV removed and belongings gathered. Pt will be transported out via wheelchair to son's car. Will continue to monior

## 2016-02-26 NOTE — Progress Notes (Signed)
Patient ID: Kathy Young, female   DOB: 03/19/1946, 70 y.o.   MRN: [id number]     Subjective:  Patient reports pain as mild.  Patient sitting up in abed and in no acute distress. Denies any CP or SOB  Objective:   VITALS:   Filed Vitals:   02/25/16 0920 02/25/16 1242 02/25/16 2025 02/26/16 0354  BP: 118/58 122/61 122/66 134/54  Pulse:  87 94 65  Temp:  99.8 F (37.7 C) 99.7 F (37.6 C) 98.3 F (36.8 C)  TempSrc:  Oral Oral Oral  Resp:  16 16 18   Weight:      SpO2:  94% 95% 94%    ABD soft Sensation intact distally Dorsiflexion/Plantar flexion intact Incision: dressing C/D/I and no drainage Dressing changed wound good  Lab Results  Component Value Date   WBC 10.1 02/26/2016   HGB 9.6* 02/26/2016   HCT 27.9* 02/26/2016   MCV 94.9 02/26/2016   PLT 139* 02/26/2016   BMET    Component Value Date/Time   NA 139 02/25/2016 0444   K 4.4 02/25/2016 0444   CL 104 02/25/2016 0444   CO2 25 02/25/2016 0444   GLUCOSE 130* 02/25/2016 0444   BUN 27* 02/25/2016 0444   CREATININE 1.14* 02/25/2016 0444   CALCIUM 9.1 02/25/2016 0444   GFRNONAA 48* 02/25/2016 0444   GFRAA 56* 02/25/2016 0444     Assessment/Plan: 2 Days Post-Op   Principal Problem:   Primary localized osteoarthritis of right knee Active Problems:   Severe obesity (BMI >= 40) (HCC)   Advance diet Up with therapy Discharge to SNF today or tomorrow WBAT Dry dressing PRN    Haskel KhanDOUGLAS PARRY, BRANDON 02/26/2016, 5:49 AM  Discussed and agree with above.    Teryl LucyJoshua Nalanie Winiecki, MD Cell [phone]

## 2016-02-26 NOTE — Progress Notes (Signed)
Physical Therapy Treatment Patient Details Name: Kathy PaisCandace E Rosander MRN: [id number] DOB: 10/21/1946 Today's Date: 02/26/2016    History of Present Illness Pt presents for right TKA. PMH includes obesity, anxiety and depression, OA, trigeminal neuralgia, lymphedema    PT Comments    Pt performed increased gait distance with min guard/min assist.  Pt required mod assist to transfer from bed to standing at RW.  Pt demonstrated poor eccentric loading to seated surface.  Pt set for d/c to SNF tomorrow.    Follow Up Recommendations  SNF     Equipment Recommendations  None recommended by PT    Recommendations for Other Services       Precautions / Restrictions Precautions Precautions: Knee Precaution Booklet Issued: No Precaution Comments: Reviewed not placing pillow, ice pack, or other object under R knee Required Braces or Orthoses: Knee Immobilizer - Right Knee Immobilizer - Right: On when out of bed or walking Restrictions Weight Bearing Restrictions: Yes RLE Weight Bearing: Weight bearing as tolerated    Mobility  Bed Mobility Overal bed mobility: Needs Assistance Bed Mobility: Sit to Supine     Supine to sit: Supervision     General bed mobility comments: elevated HOB and use rails.  pt slow to ascend to edge of bed.    Transfers Overall transfer level: Needs assistance Equipment used: Rolling walker (2 wheeled) Transfers: Sit to/from Stand Sit to Stand: Mod assist Stand pivot transfers: Min assist       General transfer comment: Pt unsuccessful after 2 attempts in lower seated position, pt required elevation of matress height to improve sit to stand transfer.  Pt required cues for hand placement and advancement of RLE forward during transition.    Ambulation/Gait Ambulation/Gait assistance: Min assist Ambulation Distance (Feet): 38 Feet Assistive device: Rolling walker (2 wheeled) Gait Pattern/deviations: Step-to pattern;Wide base of support;Decreased stance time  - right;Decreased step length - right Gait velocity: decreased   General Gait Details: Pt required cues for RW placement and gt symmetry with step to pattern.     Stairs            Wheelchair Mobility    Modified Rankin (Stroke Patients Only)       Balance Overall balance assessment: Needs assistance Sitting-balance support: No upper extremity supported Sitting balance-Leahy Scale: Fair Sitting balance - Comments: due to pain and body habitus     Standing balance-Leahy Scale: Poor                      Cognition Arousal/Alertness: Awake/alert Behavior During Therapy: WFL for tasks assessed/performed Overall Cognitive Status: Within Functional Limits for tasks assessed                      Exercises Total Joint Exercises Ankle Circles/Pumps: AROM;10 reps;Seated;Both Quad Sets: AROM;Seated;10 reps;Both Gluteal Sets: AROM;Both;10 reps Heel Slides: AAROM;Right;10 reps Hip ABduction/ADduction: AAROM;Right;10 reps Straight Leg Raises: AAROM;Right;10 reps Long Arc Quad: AROM;Right;10 reps;Seated    General Comments        Pertinent Vitals/Pain Pain Assessment: 0-10 Pain Score: 3  Pain Location: R knee and back Pain Descriptors / Indicators: Aching;Sore Pain Intervention(s): Ice applied;Monitored during session;Repositioned    Home Living                      Prior Function            PT Goals (current goals can now be found in the care plan section) Acute  Rehab PT Goals Patient Stated Goal: return home after rehab Potential to Achieve Goals: Good Progress towards PT goals: Progressing toward goals    Frequency  7X/week    PT Plan      Co-evaluation             End of Session Equipment Utilized During Treatment: Gait belt;Right knee immobilizer Activity Tolerance: Patient tolerated treatment well Patient left: in chair;with call bell/phone within reach     Time: 0900-0942 PT Time Calculation (min) (ACUTE ONLY):  42 min  Charges:  $Gait Training: 8-22 mins $Therapeutic Exercise: 8-22 mins $Therapeutic Activity: 8-22 mins                    G Codes:      Florestine Avers March 07, 2016, 9:53 AM  Joycelyn Rua, PTA pager [phone]

## 2016-03-02 ENCOUNTER — Encounter: Payer: Self-pay | Admitting: Internal Medicine

## 2016-03-02 ENCOUNTER — Non-Acute Institutional Stay (SKILLED_NURSING_FACILITY): Payer: Medicare Other | Admitting: Internal Medicine

## 2016-03-02 DIAGNOSIS — N3281 Overactive bladder: Secondary | ICD-10-CM | POA: Diagnosis not present

## 2016-03-02 DIAGNOSIS — M792 Neuralgia and neuritis, unspecified: Secondary | ICD-10-CM | POA: Diagnosis not present

## 2016-03-02 DIAGNOSIS — R2681 Unsteadiness on feet: Secondary | ICD-10-CM | POA: Diagnosis not present

## 2016-03-02 DIAGNOSIS — K5900 Constipation, unspecified: Secondary | ICD-10-CM | POA: Diagnosis not present

## 2016-03-02 DIAGNOSIS — D62 Acute posthemorrhagic anemia: Secondary | ICD-10-CM | POA: Diagnosis not present

## 2016-03-02 DIAGNOSIS — N289 Disorder of kidney and ureter, unspecified: Secondary | ICD-10-CM | POA: Diagnosis not present

## 2016-03-02 DIAGNOSIS — M1711 Unilateral primary osteoarthritis, right knee: Secondary | ICD-10-CM | POA: Diagnosis not present

## 2016-03-02 DIAGNOSIS — E1149 Type 2 diabetes mellitus with other diabetic neurological complication: Secondary | ICD-10-CM | POA: Diagnosis not present

## 2016-03-02 DIAGNOSIS — E039 Hypothyroidism, unspecified: Secondary | ICD-10-CM

## 2016-03-02 DIAGNOSIS — E785 Hyperlipidemia, unspecified: Secondary | ICD-10-CM | POA: Diagnosis not present

## 2016-03-02 DIAGNOSIS — F329 Major depressive disorder, single episode, unspecified: Secondary | ICD-10-CM

## 2016-03-02 DIAGNOSIS — B009 Herpesviral infection, unspecified: Secondary | ICD-10-CM

## 2016-03-02 DIAGNOSIS — I10 Essential (primary) hypertension: Secondary | ICD-10-CM | POA: Diagnosis not present

## 2016-03-02 DIAGNOSIS — F32A Depression, unspecified: Secondary | ICD-10-CM

## 2016-03-02 DIAGNOSIS — Z794 Long term (current) use of insulin: Secondary | ICD-10-CM

## 2016-03-02 NOTE — Progress Notes (Signed)
LOCATION: [facility]  PCP: Cain Saupe, MD   Code Status: Full Code  Goals of care: Advanced Directive information Advanced Directives 02/12/2016  Does patient have an advance directive? Yes  Type of Estate agent of [place];Living will  Does patient want to make changes to advanced directive? No - Patient declined  Copy of advanced directive(s) in chart? No - copy requested       Extended Emergency Contact Information Primary Emergency Contact: Pryor Montes [place] Home Phone: [phone] Relation: Son Secondary Emergency Contact: Pate,Curtis Address: [address]          [place], [place] [address] [place] Home Phone: [phone] Relation: Brother   Allergies  Allergen Reactions  . Dilantin [Phenytoin Sodium Extended] Hives and Itching  . Tegretol [Carbamazepine] Hives and Itching  . Latex Itching and Rash    Chief Complaint  Patient presents with  . New Admit To SNF    New Admission     HPI:  Patient is a 70 y.o. female seen today for short term rehabilitation post hospital admission from 02/24/16-02/26/16 with primary right knee OA. She underwent right total knee arthroplasty. She is seen in her room today. Her pain medication has been helpful. She mentions having low bp readings at home and one in the facility yesterday.   Review of Systems:  Constitutional: Negative for fever, chills, malaise and diaphoresis. Energy level is returning.  HENT: Negative for headache, congestion, nasal discharge, hearing loss, sore throat, difficulty swallowing. Eyes: Negative for blurred vision, double vision and discharge. Wears glasses.  Respiratory: Negative for shortness of breath and wheezing. Positive for cough with phlegm.    Cardiovascular: Negative for chest pain, palpitations, leg swelling.  Gastrointestinal: Negative for heartburn, nausea, vomiting, abdominal pain, loss of appetite, melena, diarrhea and  constipation. Last bowel movement was last night post surgery.  Genitourinary: Negative for dysuria and flank pain.  Musculoskeletal: Negative for back pain, fall in the facility.  Skin: Negative for itching, rash.  Neurological: Negative for weakness and dizziness. Psychiatric/Behavioral: Negative for depression, anxiety, insomnia and memory loss.    Past Medical History  Diagnosis Date  . Diabetes mellitus without complication (HCC)   . Obesity   . Hypertension   . Hypothyroidism   . Vitamin D deficiency   . OSA (obstructive sleep apnea)   . Lymphedema   . Trigeminal neuralgia   . Arthritis   . Depression   . Anemia   . SOB (shortness of breath)   . Reflux   . Hypercholesteremia   . Noncompliance with CPAP treatment   . Pneumonia   . Anxiety   . Seizures (HCC)     not for 20 years. does not see a neurologist  . Urinary incontinence   . Primary localized osteoarthritis of right knee 02/24/2016  . Severe obesity (BMI >= 40) (HCC) 02/24/2016   Past Surgical History  Procedure Laterality Date  . Cholecystectomy    . Shoulder surgery    . Carpal tunnel release    . Ovarian cyst removal    . Tonsillectomy    . Appendectomy    . Colonoscopy    . Tubal ligation    . Total knee arthroplasty Right 02/24/2016    Procedure: TOTAL KNEE ARTHROPLASTY;  Surgeon: Teryl Lucy, MD;  Location: [facility] OR;  Service: Orthopedics;  Laterality: Right;   Social History:   reports that she has never smoked. She does not have any smokeless tobacco  history on file. She reports that she does not drink alcohol or use illicit drugs.  Family History  Problem Relation Age of Onset  . Cancer Mother   . Hypertension Mother   . Diabetes Father   . Heart disease Father   . Heart attack Father   . Diabetes Sister   . Diabetes Brother     Medications:   Medication List       This list is accurate as of: 03/02/16  3:46 PM.  Always use your most recent med list.               atorvastatin 10 MG  tablet  Commonly known as:  LIPITOR  Take 10 mg by mouth daily at 6 PM.     baclofen 10 MG tablet  Commonly known as:  LIORESAL  Take 1 tablet (10 mg total) by mouth 3 (three) times daily. As needed for muscle spasm     cholecalciferol 1000 units tablet  Commonly known as:  VITAMIN D  Take 1,000 Units by mouth daily.     diclofenac sodium 1 % Gel  Commonly known as:  VOLTAREN  Apply 2 g topically as needed (for pain).     escitalopram 20 MG tablet  Commonly known as:  LEXAPRO  Take 20 mg by mouth at bedtime.     gabapentin 400 MG capsule  Commonly known as:  NEURONTIN  Take 400 mg by mouth 2 (two) times daily.     levothyroxine 200 MCG tablet  Commonly known as:  SYNTHROID, LEVOTHROID  Take 200 mcg by mouth daily before breakfast. **brand name only**     lisinopril 20 MG tablet  Commonly known as:  PRINIVIL,ZESTRIL  Take 20 mg by mouth daily.     metFORMIN 500 MG 24 hr tablet  Commonly known as:  GLUCOPHAGE-XR  Take 500 mg by mouth 2 (two) times daily with a meal.     multivitamin tablet  Take 1 tablet by mouth daily.     ondansetron 4 MG tablet  Commonly known as:  ZOFRAN  Take 1 tablet (4 mg total) by mouth every 8 (eight) hours as needed for nausea or vomiting.     oxybutynin 15 MG 24 hr tablet  Commonly known as:  DITROPAN XL  Take 15 mg by mouth every morning.     oxyCODONE-acetaminophen 10-325 MG tablet  Commonly known as:  PERCOCET  Take 1-2 tablets by mouth every 6 (six) hours as needed for pain. MAXIMUM TOTAL ACETAMINOPHEN DOSE IS 4000 MG PER DAY     Rivaroxaban 15 MG Tabs tablet  Commonly known as:  XARELTO  Take 1 tablet (15 mg total) by mouth daily with supper.     sennosides-docusate sodium 8.6-50 MG tablet  Commonly known as:  SENOKOT-S  Take 2 tablets by mouth daily.     traMADol 50 MG tablet  Commonly known as:  ULTRAM  Take 50 mg by mouth every 6 (six) hours as needed (for pain).     valACYclovir 500 MG tablet  Commonly known as:   VALTREX  Take 500 mg by mouth daily.        Immunizations: Immunization History  Administered Date(s) Administered  . Influenza,inj,Quad PF,36+ Mos 02/26/2016  . PPD Test 02/26/2016     Physical Exam Filed Vitals:   03/02/16 1535  BP: 118/63  Pulse: 92  Temp: 98.8 F (37.1 C)  TempSrc: Oral  Resp: 20  Height: 5' 9.5" (1.765 m)  Weight: 303  lb (137.44 kg)  SpO2: 98%   Body mass index is 44.12 kg/(m^2).  General- elderly female, morbidly obese, in no acute distress Head- normocephalic, atraumatic Nose- nno maxillary or frontal sinus tenderness, no nasal discharge Throat- moist mucus membrane Eyes- PERRLA, EOMI, no pallor, no icterus, no discharge, normal conjunctiva, normal sclera Neck- no cervical lymphadenopathy Cardiovascular- normal s1,s2, no murmur, + leg edema Respiratory- bilateral clear to auscultation, no wheeze, no rhonchi, no crackles, no use of accessory muscles Abdomen- bowel sounds present, soft, non tender Musculoskeletal- able to move all 4 extremities, limited right knee range of motion Neurological- alert and oriented to person, place and time Skin- warm and dry, right knee surgical incision with dressing in place Psychiatry- normal mood and affect    Labs reviewed: Basic Metabolic Panel:  Recent Labs  16/10/96 1338 02/12/16 1406 02/25/16 02/25/16 0444  NA 141 141 139 139  K 4.3 4.6  --  4.4  CL 107 106  --  104  CO2 26 26  --  25  GLUCOSE 104* 112*  --  130*  BUN 28* 33* 27* 27*  CREATININE 1.16* 1.11* 1.1 1.14*  CALCIUM 9.7 9.7  --  9.1   CBC:  Recent Labs  09/02/15 1338 02/12/16 1406 02/25/16 0444 02/26/16 02/26/16 0406  WBC 5.5 4.2 7.9 10.1 10.1  NEUTROABS 3.5  --   --   --   --   HGB 11.9* 12.3 10.0*  --  9.6*  HCT 36.1 36.0 30.1*  --  27.9*  MCV 96.8 96.8 96.2  --  94.9  PLT 175 158 138*  --  139*   Cardiac Enzymes:  Recent Labs  09/02/15 1338 09/02/15 1530  TROPONINI <0.03 <0.03   BNP: Invalid input(s):  POCBNP CBG:  Recent Labs  02/25/16 2137 02/26/16 0629 02/26/16 1123  GLUCAP 125* 107* 164*    Radiological Exams: Dg Knee Right Port  02/24/2016  CLINICAL DATA:  Status post right knee replacement EXAM: PORTABLE RIGHT KNEE - 1-2 VIEW COMPARISON:  None. FINDINGS: Right knee replacement is noted. Air is noted in the surgical bed. No acute soft tissue or bony abnormality is noted. IMPRESSION: Status post right knee replacement Electronically Signed   By: Alcide Clever M.D.   On: 02/24/2016 12:18    Assessment/Plan   Unsteady gait Post right knee surgical repair. Will have patient work with PT/OT as tolerated to regain strength and restore function.  Fall precautions are in place.  Right knee OA S/p right TKA. Will have her work with physical therapy and occupational therapy team to help with gait training and muscle strengthening exercises.fall precautions. Skin care. Encourage to be out of bed. Has orthopedics follow up. Continue xarelto for dvt prophylaxis. Continue percocet 10-325 mg 1-2 tab q6h prn with tramadol 50 mg q6h prn for pain. Continue baclofen 10 mg tid prn muscle spasm.  Blood loss anemia Post op, monitor cbc  Impaired renal function Check bmp  Neuropathic pain Continue neurontin 400 mg bid  Herpes simplex With frequent oral sores. On chronic valtrex 500 mg daily, monitor  HLD Continue lipitor 10 mg daily  Depression Stable mood, continue lexapro 20 mg daily  Hypothyroidism Continue synthroid 200 mcg daily  HTN Continue lisinopril 20 mg daily, monitor bp and have holding parameters  DM Lab Results  Component Value Date   HGBA1C 5.9* 02/12/2016   Monitor cbg and continue metformin 500 mg bid  OAB Continue oxybutynin  Constipation On senokot s 2 tab daily. Add  miralax daily.      Goals of care: short term rehabilitation   Labs/tests ordered: cbc, cmp 03/03/16  Family/ staff Communication: reviewed care plan with patient and nursing  supervisor    Oneal GroutMAHIMA Keric Zehren, MD Internal Medicine [facility] [address] [place], [place] [address] Cell Phone (Monday-Friday 8 am - 5 pm): [phone] On Call: [phone] and follow prompts after 5 pm and on weekends Office Phone: [phone] Office Fax: [phone]

## 2016-03-03 LAB — CBC AND DIFFERENTIAL
HCT: 27 % — AB (ref 36–46)
Hemoglobin: 8.8 g/dL — AB (ref 12.0–16.0)
Platelets: 288 10*3/uL (ref 150–399)
WBC: 6.4 10*3/mL

## 2016-03-03 LAB — BASIC METABOLIC PANEL
BUN: 28 mg/dL — AB (ref 4–21)
CREATININE: 1.1 mg/dL (ref <=1.1)
GLUCOSE: 114 mg/dL
POTASSIUM: 5.1 mmol/L (ref 3.4–5.3)
Sodium: 136 mmol/L — AB (ref 137–147)

## 2016-03-03 LAB — HEPATIC FUNCTION PANEL
ALT: 24 U/L (ref 7–35)
AST: 25 U/L (ref 13–35)
Alkaline Phosphatase: 62 U/L (ref 25–125)

## 2016-03-04 ENCOUNTER — Encounter: Payer: Self-pay | Admitting: Family

## 2016-03-04 NOTE — Progress Notes (Signed)
Location:  [facility] Room Number: 603 Place of Service:  SNF (31)  Provider: Richarda Blade NP  PCP: Kathy Saupe, MD Patient Care Team: Kathy Saupe, MD as PCP - General (Family Medicine) Kathy Lucy, MD as Consulting Physician (Orthopedic Surgery)  Extended Emergency Contact Information Primary Emergency Contact: Kathy Young [place] Home Phone: [phone] Relation: Son Secondary Emergency Contact: Kathy,Young Address: [address]          [place], [place] [address] [place] Home Phone: [phone] Relation: Brother  Code Status:  Goals of care:  Advanced Directive information Advanced Directives 03/04/2016  Does patient have an advance directive? Yes  Type of Advance Directive Healthcare Power of Attorney  Does patient want to make changes to advanced directive? No - Patient declined  Copy of advanced directive(s) in chart? No - copy requested     Allergies  Allergen Reactions  . Dilantin [Phenytoin Sodium Extended] Hives and Itching  . Tegretol [Carbamazepine] Hives and Itching  . Latex Itching and Rash    Chief Complaint  Patient presents with  . Discharge Note    HPI:  70 y.o. female      Past Medical History  Diagnosis Date  . Diabetes mellitus without complication (HCC)   . Obesity   . Hypertension   . Hypothyroidism   . Vitamin D deficiency   . OSA (obstructive sleep apnea)   . Lymphedema   . Trigeminal neuralgia   . Arthritis   . Depression   . Anemia   . SOB (shortness of breath)   . Reflux   . Hypercholesteremia   . Noncompliance with CPAP treatment   . Pneumonia   . Anxiety   . Seizures (HCC)     not for 20 years. does not see a neurologist  . Urinary incontinence   . Primary localized osteoarthritis of right knee 02/24/2016  . Severe obesity (BMI >= 40) (HCC) 02/24/2016    Past Surgical History  Procedure Laterality Date  . Cholecystectomy    . Shoulder  surgery    . Carpal tunnel release    . Ovarian cyst removal    . Tonsillectomy    . Appendectomy    . Colonoscopy    . Tubal ligation    . Total knee arthroplasty Right 02/24/2016    Procedure: TOTAL KNEE ARTHROPLASTY;  Surgeon: Kathy Lucy, MD;  Location: [facility] OR;  Service: Orthopedics;  Laterality: Right;      reports that she has never smoked. She does not have any smokeless tobacco history on file. She reports that she does not drink alcohol or use illicit drugs. Social History   Social History  . Marital Status: Divorced    Spouse Name: N/A  . Number of Children: N/A  . Years of Education: N/A   Occupational History  . Not on file.   Social History Main Topics  . Smoking status: Never Smoker   . Smokeless tobacco: Not on file  . Alcohol Use: No  . Drug Use: No  . Sexual Activity: Not on file   Other Topics Concern  . Not on file   Social History Narrative   Functional Status Survey:    Allergies  Allergen Reactions  . Dilantin [Phenytoin Sodium Extended] Hives and Itching  . Tegretol [Carbamazepine] Hives and Itching  . Latex Itching and Rash    Pertinent  Health Maintenance Due  Topic Date Due  . FOOT EXAM  06/24/1956  .  OPHTHALMOLOGY EXAM  [birth year]/1957  . COLONOSCOPY  06/24/1997  . DEXA SCAN  06/24/2012  . PNA vac Low Risk Adult (1 of 2 - PCV13) 06/24/2012  . MAMMOGRAM  06/29/2012  . INFLUENZA VACCINE  07/20/2016  . HEMOGLOBIN A1C  08/11/2016    Medications:   Medication List       This list is accurate as of: 03/04/16 11:46 AM.  Always use your most recent med list.               atorvastatin 10 MG tablet  Commonly known as:  LIPITOR  Take 10 mg by mouth daily at 6 PM. For hypercholesteremia     baclofen 10 MG tablet  Commonly known as:  LIORESAL  Take 1 tablet (10 mg total) by mouth 3 (three) times daily. As needed for muscle spasm     cholecalciferol 1000 units tablet  Commonly known as:  VITAMIN D  Take 1,000 Units by mouth daily.       diclofenac sodium 1 % Gel  Commonly known as:  VOLTAREN  Apply 2 g topically as needed (for pain).     escitalopram 20 MG tablet  Commonly known as:  LEXAPRO  Take 20 mg by mouth at bedtime. For depression     gabapentin 400 MG capsule  Commonly known as:  NEURONTIN  Take 400 mg by mouth 2 (two) times daily.     levothyroxine 200 MCG tablet  Commonly known as:  SYNTHROID, LEVOTHROID  Take 200 mcg by mouth daily before breakfast. **brand name only* For hypothyroidism     lisinopril 20 MG tablet  Commonly known as:  PRINIVIL,ZESTRIL  Take 20 mg by mouth daily. For HTN     metFORMIN 500 MG 24 hr tablet  Commonly known as:  GLUCOPHAGE-XR  Take 500 mg by mouth 2 (two) times daily with a meal. For diabetes     multivitamin tablet  Take 1 tablet by mouth daily.     ondansetron 4 MG tablet  Commonly known as:  ZOFRAN  Take 1 tablet (4 mg total) by mouth every 8 (eight) hours as needed for nausea or vomiting.     oxybutynin 15 MG 24 hr tablet  Commonly known as:  DITROPAN XL  Take 15 mg by mouth every morning. For urinary incontinence     Rivaroxaban 15 MG Tabs tablet  Commonly known as:  XARELTO  Take 1 tablet (15 mg total) by mouth daily with supper.     sennosides-docusate sodium 8.6-50 MG tablet  Commonly known as:  SENOKOT-S  Take 2 tablets by mouth daily.     traMADol 50 MG tablet  Commonly known as:  ULTRAM  Take 50 mg by mouth every 6 (six) hours as needed (for pain).     valACYclovir 500 MG tablet  Commonly known as:  VALTREX  Take 500 mg by mouth daily.        Review of Systems  Filed Vitals:   03/04/16 1121  BP: 126/64  Pulse: 82  Temp: 98 F (36.7 C)  TempSrc: Oral  Resp: 20  Height: 5' 9.5" (1.765 m)  Weight: 303 lb (137.44 kg)  SpO2: 97%   Body mass index is 44.12 kg/(m^2). Physical Exam  Labs reviewed: Basic Metabolic Panel:  Recent Labs  16/09/9608/13/16 1338 02/12/16 1406 02/25/16 02/25/16 0444 03/03/16  NA 141 141 139 139 136*  K  4.3 4.6  --  4.4 5.1  CL 107 106  --  104  --  CO2 26 26  --  25  --   GLUCOSE 104* 112*  --  130*  --   BUN 28* 33* 27* 27* 28*  CREATININE 1.16* 1.11* 1.1 1.14* 1.1  CALCIUM 9.7 9.7  --  9.1  --    Liver Function Tests:  Recent Labs  03/03/16  AST 25  ALT 24  ALKPHOS 62   No results for input(s): LIPASE, AMYLASE in the last 8760 hours. No results for input(s): AMMONIA in the last 8760 hours. CBC:  Recent Labs  09/02/15 1338 02/12/16 1406 02/25/16 0444 02/26/16 02/26/16 0406 03/03/16  WBC 5.5 4.2 7.9 10.1 10.1 6.4  NEUTROABS 3.5  --   --   --   --   --   HGB 11.9* 12.3 10.0*  --  9.6* 8.8*  HCT 36.1 36.0 30.1*  --  27.9* 27*  MCV 96.8 96.8 96.2  --  94.9  --   PLT 175 158 138*  --  139* 288   Cardiac Enzymes:  Recent Labs  09/02/15 1338 09/02/15 1530  TROPONINI <0.03 <0.03   BNP: Invalid input(s): POCBNP CBG:  Recent Labs  02/25/16 2137 02/26/16 0629 02/26/16 1123  GLUCAP 125* 107* 164*    Procedures and Imaging Studies During Stay: Dg Knee Right Port  02/24/2016  CLINICAL DATA:  Status post right knee replacement EXAM: PORTABLE RIGHT KNEE - 1-2 VIEW COMPARISON:  None. FINDINGS: Right knee replacement is noted. Air is noted in the surgical bed. No acute soft tissue or bony abnormality is noted. IMPRESSION: Status post right knee replacement Electronically Signed   By: Alcide Clever M.D.   On: 02/24/2016 12:18    Assessment/Plan:   There are no diagnoses linked to this encounter.   Patient is being discharged with the following home health services:    Patient is being discharged with the following durable medical equipment:    Patient has been advised to f/u with their PCP in 1-2 weeks to bring them up to date on their rehab stay.  Social services at facility was responsible for arranging this appointment.  Pt was provided with a 30 day supply of prescriptions for medications and refills must be obtained from their PCP.  For controlled substances, a  more limited supply may be provided adequate until PCP appointment only.  Future labs/tests needed:     This encounter was created in error - please disregard.

## 2016-03-08 DIAGNOSIS — M1711 Unilateral primary osteoarthritis, right knee: Secondary | ICD-10-CM | POA: Diagnosis not present

## 2016-03-09 ENCOUNTER — Non-Acute Institutional Stay (SKILLED_NURSING_FACILITY): Payer: Medicare Other | Admitting: Family

## 2016-03-09 DIAGNOSIS — E559 Vitamin D deficiency, unspecified: Secondary | ICD-10-CM | POA: Diagnosis not present

## 2016-03-09 DIAGNOSIS — I10 Essential (primary) hypertension: Secondary | ICD-10-CM | POA: Diagnosis not present

## 2016-03-09 DIAGNOSIS — E039 Hypothyroidism, unspecified: Secondary | ICD-10-CM | POA: Diagnosis not present

## 2016-03-09 DIAGNOSIS — F329 Major depressive disorder, single episode, unspecified: Secondary | ICD-10-CM | POA: Diagnosis not present

## 2016-03-09 DIAGNOSIS — M25561 Pain in right knee: Secondary | ICD-10-CM | POA: Diagnosis not present

## 2016-03-09 DIAGNOSIS — M1711 Unilateral primary osteoarthritis, right knee: Secondary | ICD-10-CM | POA: Diagnosis not present

## 2016-03-09 DIAGNOSIS — Z6841 Body Mass Index (BMI) 40.0 and over, adult: Secondary | ICD-10-CM | POA: Diagnosis not present

## 2016-03-09 DIAGNOSIS — E785 Hyperlipidemia, unspecified: Secondary | ICD-10-CM | POA: Diagnosis not present

## 2016-03-09 DIAGNOSIS — G629 Polyneuropathy, unspecified: Secondary | ICD-10-CM | POA: Insufficient documentation

## 2016-03-09 DIAGNOSIS — F32A Depression, unspecified: Secondary | ICD-10-CM

## 2016-03-09 DIAGNOSIS — E119 Type 2 diabetes mellitus without complications: Secondary | ICD-10-CM | POA: Insufficient documentation

## 2016-03-09 NOTE — Progress Notes (Signed)
Patient ID: Kathy Young, female   DOB: 09/18/1946, 70 y.o.   MRN: [id number]  Location:  [facility]   Place of Service:  SNF (31)  Provider:Mahima Glade LloydPandey, MD   PCP: Cain SaupeFULP, CAMMIE, MD Patient Care Team: Cain Saupeammie Fulp, MD as PCP - General (Family Medicine) Teryl LucyJoshua Landau, MD as Consulting Physician (Orthopedic Surgery)  Extended Emergency Contact Information Primary Emergency Contact: Pryor MontesHall,David  [place] Home Phone: [phone] Relation: Son Secondary Emergency Contact: Pate,Curtis Address: [address]          [place], [place] [address] [place] Home Phone: [phone] Relation: Brother  Code Status: Full Code  Goals of care:  Advanced Directive information Advanced Directives 03/04/2016  Does patient have an advance directive? Yes  Type of Advance Directive Healthcare Power of Attorney  Does patient want to make changes to advanced directive? No - Patient declined  Copy of advanced directive(s) in chart? No - copy requested     Allergies  Allergen Reactions  . Dilantin [Phenytoin Sodium Extended] Hives and Itching  . Tegretol [Carbamazepine] Hives and Itching  . Latex Itching and Rash    Chief Complaint  Patient presents with  . Discharge Note    HPI:  70 y.o. female seen today at  [facility] for discharge home. She was here for  short term rehabilitation post hospital admission from 02/24/16-02/26/16 with primary right knee OA. She underwent right total knee arthroplasty. She has a medical history of Type 2 DM, HTN, Hyperlipidemia, Hypothyroidism, Lymphedema, Arthritis, Depression, anxiety among others. She is seen in her room today.She has worked well with PT/OT now stable for discharge with follow up  Ortho outpatient Therapy per Dr. Dion SaucierLandau orders.She was seen by Ortho 03/08/2016 to follow up in 4 weeks. She will require a front wheel walker to allow her to maintain current level of independence with  ADL's. DME will be arranged by facility social worker prior to discharge.     Past Medical History  Diagnosis Date  . Diabetes mellitus without complication (HCC)   . Obesity   . Hypertension   . Hypothyroidism   . Vitamin D deficiency   . OSA (obstructive sleep apnea)   . Lymphedema   . Trigeminal neuralgia   . Arthritis   . Depression   . Anemia   . SOB (shortness of breath)   . Reflux   . Hypercholesteremia   . Noncompliance with CPAP treatment   . Pneumonia   . Anxiety   . Seizures (HCC)     not for 20 years. does not see a neurologist  . Urinary incontinence   . Primary localized osteoarthritis of right knee 02/24/2016  . Severe obesity (BMI >= 40) (HCC) 02/24/2016    Past Surgical History  Procedure Laterality Date  . Cholecystectomy    . Shoulder surgery    . Carpal tunnel release    . Ovarian cyst removal    . Tonsillectomy    . Appendectomy    . Colonoscopy    . Tubal ligation    . Total knee arthroplasty Right 02/24/2016    Procedure: TOTAL KNEE ARTHROPLASTY;  Surgeon: Teryl LucyJoshua Landau, MD;  Location: [facility] OR;  Service: Orthopedics;  Laterality: Right;      reports that she has never smoked. She does not have any smokeless tobacco history on file. She reports that she does not drink alcohol or use illicit drugs. Social History   Social History  .  Marital Status: Divorced    Spouse Name: N/A  . Number of Children: N/A  . Years of Education: N/A   Occupational History  . Not on file.   Social History Main Topics  . Smoking status: Never Smoker   . Smokeless tobacco: Not on file  . Alcohol Use: No  . Drug Use: No  . Sexual Activity: Not on file   Other Topics Concern  . Not on file   Social History Narrative   Functional Status Survey:    Allergies  Allergen Reactions  . Dilantin [Phenytoin Sodium Extended] Hives and Itching  . Tegretol [Carbamazepine] Hives and Itching  . Latex Itching and Rash    Pertinent  Health Maintenance Due  Topic  Date Due  . FOOT EXAM  06/24/1957  . OPHTHALMOLOGY EXAM  06/24/1957  . COLONOSCOPY  06/24/1997  . DEXA SCAN  06/24/2012  . PNA vac Low Risk Adult (1 of 2 - PCV13) 06/24/2012  . MAMMOGRAM  06/29/2012  . INFLUENZA VACCINE  07/20/2016  . HEMOGLOBIN A1C  08/11/2016    Medications:   Medication List       This list is accurate as of: 03/09/16  5:56 PM.  Always use your most recent med list.               atorvastatin 10 MG tablet  Commonly known as:  LIPITOR  Take 10 mg by mouth daily at 6 PM. For hypercholesteremia     baclofen 10 MG tablet  Commonly known as:  LIORESAL  Take 1 tablet (10 mg total) by mouth 3 (three) times daily. As needed for muscle spasm     cholecalciferol 1000 units tablet  Commonly known as:  VITAMIN D  Take 1,000 Units by mouth daily.     diclofenac sodium 1 % Gel  Commonly known as:  VOLTAREN  Apply 2 g topically as needed (for pain).     escitalopram 20 MG tablet  Commonly known as:  LEXAPRO  Take 20 mg by mouth at bedtime. For depression     gabapentin 400 MG capsule  Commonly known as:  NEURONTIN  Take 400 mg by mouth 2 (two) times daily.     levothyroxine 200 MCG tablet  Commonly known as:  SYNTHROID, LEVOTHROID  Take 200 mcg by mouth daily before breakfast. **brand name only* For hypothyroidism     lisinopril 20 MG tablet  Commonly known as:  PRINIVIL,ZESTRIL  Take 20 mg by mouth daily. For HTN     metFORMIN 500 MG 24 hr tablet  Commonly known as:  GLUCOPHAGE-XR  Take 500 mg by mouth 2 (two) times daily with a meal. For diabetes     multivitamin tablet  Take 1 tablet by mouth daily.     ondansetron 4 MG tablet  Commonly known as:  ZOFRAN  Take 1 tablet (4 mg total) by mouth every 8 (eight) hours as needed for nausea or vomiting.     oxybutynin 15 MG 24 hr tablet  Commonly known as:  DITROPAN XL  Take 15 mg by mouth every morning. For urinary incontinence     Rivaroxaban 15 MG Tabs tablet  Commonly known as:  XARELTO  Take  1 tablet (15 mg total) by mouth daily with supper.     sennosides-docusate sodium 8.6-50 MG tablet  Commonly known as:  SENOKOT-S  Take 2 tablets by mouth daily.     traMADol 50 MG tablet  Commonly known as:  ULTRAM  Take 50  mg by mouth every 6 (six) hours as needed (for pain).     valACYclovir 500 MG tablet  Commonly known as:  VALTREX  Take 500 mg by mouth daily.        Review of Systems  Constitutional: Negative for fever, chills, activity change, appetite change and fatigue.  HENT: Negative for congestion, rhinorrhea, sinus pressure, sneezing and sore throat.   Eyes: Negative.   Respiratory: Negative.   Cardiovascular: Positive for leg swelling. Negative for chest pain and palpitations.  Gastrointestinal: Negative.   Endocrine: Negative.   Genitourinary: Negative.   Musculoskeletal: Positive for gait problem.  Skin: Negative.   Allergic/Immunologic: Negative.   Neurological: Negative.   Hematological: Negative.   Psychiatric/Behavioral: Negative.     Filed Vitals:   03/09/16 1743  BP: 118/60  Pulse: 69  Temp: 98 F (36.7 C)  Resp: 18  Weight: 309 lb (140.161 kg)  SpO2: 100%   Body mass index is 44.99 kg/(m^2). Physical Exam  Constitutional: She is oriented to person, place, and time. She appears well-developed. No distress.  Obese   HENT:  Head: Normocephalic.  Mouth/Throat: Oropharynx is clear and moist.  Eyes: Conjunctivae and EOM are normal. Pupils are equal, round, and reactive to light. Right eye exhibits no discharge. Left eye exhibits no discharge. No scleral icterus.  Neck: Normal range of motion. No JVD present. No thyromegaly present.  Cardiovascular: Normal rate, regular rhythm, normal heart sounds and intact distal pulses.  Exam reveals no gallop and no friction rub.   No murmur heard. Pulmonary/Chest: Effort normal and breath sounds normal. No respiratory distress. She has no wheezes. She has no rales.  Abdominal: Soft. Bowel sounds are normal.  She exhibits no distension and no mass. There is no tenderness. There is no rebound and no guarding.  Musculoskeletal: She exhibits no tenderness.  Limited ROM right knee due to pain  Lymphadenopathy:    She has no cervical adenopathy.  Neurological: She is oriented to person, place, and time.  Skin: Skin is warm and dry. No rash noted. No erythema. No pallor.  Psychiatric: She has a normal mood and affect.    Labs reviewed: Basic Metabolic Panel:  Recent Labs  16/10/96 1338 02/12/16 1406 02/25/16 02/25/16 0444 03/03/16  NA 141 141 139 139 136*  K 4.3 4.6  --  4.4 5.1  CL 107 106  --  104  --   CO2 26 26  --  25  --   GLUCOSE 104* 112*  --  130*  --   BUN 28* 33* 27* 27* 28*  CREATININE 1.16* 1.11* 1.1 1.14* 1.1  CALCIUM 9.7 9.7  --  9.1  --    Liver Function Tests:  Recent Labs  03/03/16  AST 25  ALT 24  ALKPHOS 62   No results for input(s): LIPASE, AMYLASE in the last 8760 hours. No results for input(s): AMMONIA in the last 8760 hours. CBC:  Recent Labs  09/02/15 1338 02/12/16 1406 02/25/16 0444 02/26/16 02/26/16 0406 03/03/16  WBC 5.5 4.2 7.9 10.1 10.1 6.4  NEUTROABS 3.5  --   --   --   --   --   HGB 11.9* 12.3 10.0*  --  9.6* 8.8*  HCT 36.1 36.0 30.1*  --  27.9* 27*  MCV 96.8 96.8 96.2  --  94.9  --   PLT 175 158 138*  --  139* 288   Cardiac Enzymes:  Recent Labs  09/02/15 1338 09/02/15 1530  TROPONINI <0.03 <  0.03   BNP: Invalid input(s): POCBNP CBG:  Recent Labs  02/25/16 2137 02/26/16 0629 02/26/16 1123  GLUCAP 125* 107* 164*    Procedures and Imaging Studies During Stay: Dg Knee Right Port  02/24/2016  CLINICAL DATA:  Status post right knee replacement EXAM: PORTABLE RIGHT KNEE - 1-2 VIEW COMPARISON:  None. FINDINGS: Right knee replacement is noted. Air is noted in the surgical bed. No acute soft tissue or bony abnormality is noted. IMPRESSION: Status post right knee replacement Electronically Signed   By: Alcide Clever M.D.   On:  02/24/2016 12:18    Assessment/Plan:   1. Hypothyroidism, unspecified hypothyroidism type Continue with levothyroxine 200 mcg Tablet PCP to monitor TSH level.   2. Essential (primary) hypertension B/p stable. Continue on Lisinopril PCP to monitor BMP   3. Primary localized osteoarthritis of right knee S/p right knee arthroplasty. Pain under controlled with current pain medication.Follow up with Ortho Dr. Dion Saucier as directed. Will Discharge home with follow up  Ortho outpatient Therapy per Dr. Dion Saucier orders.She will require a front wheel walker to allow her to maintain current level of independence with ADL's. DME will be arranged by facility social worker prior to discharge.   4. HLD (hyperlipidemia) Continue on Atorvastatin. PCP to monitor lipid panel   5. Avitaminosis D Continue on Vit D 1000 units daily.   6. Body mass index (BMI) of 50-59.9 in adult [facility]) Will resume weight management at [facility] once right knee is healed and cleared by ortho.   7. Right knee pain S/p Arthroplasty.Current regimen effective.Continue to monitor and wean off pain medication as tolerated.  8. Depression  No mood changes. Continue on escitalopram     Patient is being discharged with the following home health services:  None. follow up Ortho outpatient Therapy per Dr. Dion Saucier orders.  Patient is being discharged with the following durable medical equipment:   front wheel walker to allow her to maintain current level of independence with ADL's. DME will be arranged by facility social worker prior to discharge.   Rx written x 1 month supply  Patient has been advised to f/u with their PCP in 1-2 weeks to bring them up to date on their rehab stay.  Social services at facility was responsible for arranging this appointment.  Pt was provided with a 30 day supply of prescriptions for medications and refills must be obtained from their PCP.  For controlled substances, a more limited supply may be  provided adequate until PCP appointment only.  Future labs/tests needed:  CBC, BMP with PCP

## 2016-03-11 DIAGNOSIS — M25561 Pain in right knee: Secondary | ICD-10-CM | POA: Diagnosis not present

## 2016-03-11 DIAGNOSIS — M6281 Muscle weakness (generalized): Secondary | ICD-10-CM | POA: Diagnosis not present

## 2016-03-11 DIAGNOSIS — M25661 Stiffness of right knee, not elsewhere classified: Secondary | ICD-10-CM | POA: Diagnosis not present

## 2016-03-11 DIAGNOSIS — R262 Difficulty in walking, not elsewhere classified: Secondary | ICD-10-CM | POA: Diagnosis not present

## 2016-03-16 DIAGNOSIS — R262 Difficulty in walking, not elsewhere classified: Secondary | ICD-10-CM | POA: Diagnosis not present

## 2016-03-16 DIAGNOSIS — M6281 Muscle weakness (generalized): Secondary | ICD-10-CM | POA: Diagnosis not present

## 2016-03-16 DIAGNOSIS — M25661 Stiffness of right knee, not elsewhere classified: Secondary | ICD-10-CM | POA: Diagnosis not present

## 2016-03-16 DIAGNOSIS — M25561 Pain in right knee: Secondary | ICD-10-CM | POA: Diagnosis not present

## 2016-03-18 DIAGNOSIS — M25661 Stiffness of right knee, not elsewhere classified: Secondary | ICD-10-CM | POA: Diagnosis not present

## 2016-03-18 DIAGNOSIS — R262 Difficulty in walking, not elsewhere classified: Secondary | ICD-10-CM | POA: Diagnosis not present

## 2016-03-18 DIAGNOSIS — M25561 Pain in right knee: Secondary | ICD-10-CM | POA: Diagnosis not present

## 2016-03-18 DIAGNOSIS — M6281 Muscle weakness (generalized): Secondary | ICD-10-CM | POA: Diagnosis not present

## 2016-03-19 DIAGNOSIS — M25661 Stiffness of right knee, not elsewhere classified: Secondary | ICD-10-CM | POA: Diagnosis not present

## 2016-03-19 DIAGNOSIS — M6281 Muscle weakness (generalized): Secondary | ICD-10-CM | POA: Diagnosis not present

## 2016-03-19 DIAGNOSIS — M25561 Pain in right knee: Secondary | ICD-10-CM | POA: Diagnosis not present

## 2016-03-19 DIAGNOSIS — R262 Difficulty in walking, not elsewhere classified: Secondary | ICD-10-CM | POA: Diagnosis not present

## 2016-03-22 DIAGNOSIS — M6281 Muscle weakness (generalized): Secondary | ICD-10-CM | POA: Diagnosis not present

## 2016-03-22 DIAGNOSIS — M25561 Pain in right knee: Secondary | ICD-10-CM | POA: Diagnosis not present

## 2016-03-22 DIAGNOSIS — M25661 Stiffness of right knee, not elsewhere classified: Secondary | ICD-10-CM | POA: Diagnosis not present

## 2016-03-22 DIAGNOSIS — R262 Difficulty in walking, not elsewhere classified: Secondary | ICD-10-CM | POA: Diagnosis not present

## 2016-03-24 DIAGNOSIS — Z96651 Presence of right artificial knee joint: Secondary | ICD-10-CM | POA: Diagnosis not present

## 2016-03-25 DIAGNOSIS — R262 Difficulty in walking, not elsewhere classified: Secondary | ICD-10-CM | POA: Diagnosis not present

## 2016-03-25 DIAGNOSIS — M25561 Pain in right knee: Secondary | ICD-10-CM | POA: Diagnosis not present

## 2016-03-25 DIAGNOSIS — M6281 Muscle weakness (generalized): Secondary | ICD-10-CM | POA: Diagnosis not present

## 2016-03-25 DIAGNOSIS — M25661 Stiffness of right knee, not elsewhere classified: Secondary | ICD-10-CM | POA: Diagnosis not present

## 2016-03-26 DIAGNOSIS — M25561 Pain in right knee: Secondary | ICD-10-CM | POA: Diagnosis not present

## 2016-03-26 DIAGNOSIS — M6281 Muscle weakness (generalized): Secondary | ICD-10-CM | POA: Diagnosis not present

## 2016-03-26 DIAGNOSIS — M25661 Stiffness of right knee, not elsewhere classified: Secondary | ICD-10-CM | POA: Diagnosis not present

## 2016-03-26 DIAGNOSIS — R262 Difficulty in walking, not elsewhere classified: Secondary | ICD-10-CM | POA: Diagnosis not present

## 2016-03-29 DIAGNOSIS — M25561 Pain in right knee: Secondary | ICD-10-CM | POA: Diagnosis not present

## 2016-03-29 DIAGNOSIS — M6281 Muscle weakness (generalized): Secondary | ICD-10-CM | POA: Diagnosis not present

## 2016-03-29 DIAGNOSIS — M25661 Stiffness of right knee, not elsewhere classified: Secondary | ICD-10-CM | POA: Diagnosis not present

## 2016-03-29 DIAGNOSIS — R262 Difficulty in walking, not elsewhere classified: Secondary | ICD-10-CM | POA: Diagnosis not present

## 2016-03-29 DIAGNOSIS — M5431 Sciatica, right side: Secondary | ICD-10-CM | POA: Diagnosis not present

## 2016-03-29 DIAGNOSIS — M2560 Stiffness of unspecified joint, not elsewhere classified: Secondary | ICD-10-CM | POA: Diagnosis not present

## 2016-03-30 DIAGNOSIS — M5431 Sciatica, right side: Secondary | ICD-10-CM | POA: Diagnosis not present

## 2016-03-30 DIAGNOSIS — M2560 Stiffness of unspecified joint, not elsewhere classified: Secondary | ICD-10-CM | POA: Diagnosis not present

## 2016-03-30 DIAGNOSIS — M6281 Muscle weakness (generalized): Secondary | ICD-10-CM | POA: Diagnosis not present

## 2016-03-30 DIAGNOSIS — M25561 Pain in right knee: Secondary | ICD-10-CM | POA: Diagnosis not present

## 2016-03-30 DIAGNOSIS — M25661 Stiffness of right knee, not elsewhere classified: Secondary | ICD-10-CM | POA: Diagnosis not present

## 2016-03-30 DIAGNOSIS — R262 Difficulty in walking, not elsewhere classified: Secondary | ICD-10-CM | POA: Diagnosis not present

## 2016-04-01 DIAGNOSIS — M6281 Muscle weakness (generalized): Secondary | ICD-10-CM | POA: Diagnosis not present

## 2016-04-01 DIAGNOSIS — R262 Difficulty in walking, not elsewhere classified: Secondary | ICD-10-CM | POA: Diagnosis not present

## 2016-04-01 DIAGNOSIS — M25661 Stiffness of right knee, not elsewhere classified: Secondary | ICD-10-CM | POA: Diagnosis not present

## 2016-04-01 DIAGNOSIS — M25561 Pain in right knee: Secondary | ICD-10-CM | POA: Diagnosis not present

## 2016-04-05 DIAGNOSIS — M6281 Muscle weakness (generalized): Secondary | ICD-10-CM | POA: Diagnosis not present

## 2016-04-05 DIAGNOSIS — M25561 Pain in right knee: Secondary | ICD-10-CM | POA: Diagnosis not present

## 2016-04-05 DIAGNOSIS — M25661 Stiffness of right knee, not elsewhere classified: Secondary | ICD-10-CM | POA: Diagnosis not present

## 2016-04-05 DIAGNOSIS — R262 Difficulty in walking, not elsewhere classified: Secondary | ICD-10-CM | POA: Diagnosis not present

## 2016-04-06 DIAGNOSIS — Z79899 Other long term (current) drug therapy: Secondary | ICD-10-CM | POA: Diagnosis not present

## 2016-04-06 DIAGNOSIS — E119 Type 2 diabetes mellitus without complications: Secondary | ICD-10-CM | POA: Diagnosis not present

## 2016-04-06 DIAGNOSIS — M5430 Sciatica, unspecified side: Secondary | ICD-10-CM | POA: Diagnosis not present

## 2016-04-06 DIAGNOSIS — E039 Hypothyroidism, unspecified: Secondary | ICD-10-CM | POA: Diagnosis not present

## 2016-04-06 DIAGNOSIS — E785 Hyperlipidemia, unspecified: Secondary | ICD-10-CM | POA: Diagnosis not present

## 2016-04-06 DIAGNOSIS — I10 Essential (primary) hypertension: Secondary | ICD-10-CM | POA: Diagnosis not present

## 2016-04-06 DIAGNOSIS — Z791 Long term (current) use of non-steroidal anti-inflammatories (NSAID): Secondary | ICD-10-CM | POA: Diagnosis not present

## 2016-04-06 DIAGNOSIS — Z96659 Presence of unspecified artificial knee joint: Secondary | ICD-10-CM | POA: Diagnosis not present

## 2016-04-07 DIAGNOSIS — M5416 Radiculopathy, lumbar region: Secondary | ICD-10-CM | POA: Diagnosis not present

## 2016-04-12 DIAGNOSIS — M25661 Stiffness of right knee, not elsewhere classified: Secondary | ICD-10-CM | POA: Diagnosis not present

## 2016-04-12 DIAGNOSIS — R262 Difficulty in walking, not elsewhere classified: Secondary | ICD-10-CM | POA: Diagnosis not present

## 2016-04-12 DIAGNOSIS — M6281 Muscle weakness (generalized): Secondary | ICD-10-CM | POA: Diagnosis not present

## 2016-04-12 DIAGNOSIS — M25561 Pain in right knee: Secondary | ICD-10-CM | POA: Diagnosis not present

## 2016-04-14 DIAGNOSIS — R262 Difficulty in walking, not elsewhere classified: Secondary | ICD-10-CM | POA: Diagnosis not present

## 2016-04-14 DIAGNOSIS — M6281 Muscle weakness (generalized): Secondary | ICD-10-CM | POA: Diagnosis not present

## 2016-04-14 DIAGNOSIS — M2560 Stiffness of unspecified joint, not elsewhere classified: Secondary | ICD-10-CM | POA: Diagnosis not present

## 2016-04-14 DIAGNOSIS — M25561 Pain in right knee: Secondary | ICD-10-CM | POA: Diagnosis not present

## 2016-04-14 DIAGNOSIS — M25661 Stiffness of right knee, not elsewhere classified: Secondary | ICD-10-CM | POA: Diagnosis not present

## 2016-04-14 DIAGNOSIS — M5431 Sciatica, right side: Secondary | ICD-10-CM | POA: Diagnosis not present

## 2016-04-16 DIAGNOSIS — M2560 Stiffness of unspecified joint, not elsewhere classified: Secondary | ICD-10-CM | POA: Diagnosis not present

## 2016-04-16 DIAGNOSIS — M25661 Stiffness of right knee, not elsewhere classified: Secondary | ICD-10-CM | POA: Diagnosis not present

## 2016-04-16 DIAGNOSIS — M6281 Muscle weakness (generalized): Secondary | ICD-10-CM | POA: Diagnosis not present

## 2016-04-16 DIAGNOSIS — M25561 Pain in right knee: Secondary | ICD-10-CM | POA: Diagnosis not present

## 2016-04-16 DIAGNOSIS — M5431 Sciatica, right side: Secondary | ICD-10-CM | POA: Diagnosis not present

## 2016-04-16 DIAGNOSIS — R262 Difficulty in walking, not elsewhere classified: Secondary | ICD-10-CM | POA: Diagnosis not present

## 2016-04-19 DIAGNOSIS — M6281 Muscle weakness (generalized): Secondary | ICD-10-CM | POA: Diagnosis not present

## 2016-04-19 DIAGNOSIS — M25661 Stiffness of right knee, not elsewhere classified: Secondary | ICD-10-CM | POA: Diagnosis not present

## 2016-04-19 DIAGNOSIS — R262 Difficulty in walking, not elsewhere classified: Secondary | ICD-10-CM | POA: Diagnosis not present

## 2016-04-19 DIAGNOSIS — M25561 Pain in right knee: Secondary | ICD-10-CM | POA: Diagnosis not present

## 2016-04-21 DIAGNOSIS — M5431 Sciatica, right side: Secondary | ICD-10-CM | POA: Diagnosis not present

## 2016-04-21 DIAGNOSIS — M2560 Stiffness of unspecified joint, not elsewhere classified: Secondary | ICD-10-CM | POA: Diagnosis not present

## 2016-04-21 DIAGNOSIS — D649 Anemia, unspecified: Secondary | ICD-10-CM | POA: Diagnosis not present

## 2016-04-21 DIAGNOSIS — M6281 Muscle weakness (generalized): Secondary | ICD-10-CM | POA: Diagnosis not present

## 2016-04-21 DIAGNOSIS — R262 Difficulty in walking, not elsewhere classified: Secondary | ICD-10-CM | POA: Diagnosis not present

## 2016-04-23 DIAGNOSIS — M6281 Muscle weakness (generalized): Secondary | ICD-10-CM | POA: Diagnosis not present

## 2016-04-23 DIAGNOSIS — M25661 Stiffness of right knee, not elsewhere classified: Secondary | ICD-10-CM | POA: Diagnosis not present

## 2016-04-23 DIAGNOSIS — R262 Difficulty in walking, not elsewhere classified: Secondary | ICD-10-CM | POA: Diagnosis not present

## 2016-04-23 DIAGNOSIS — M25561 Pain in right knee: Secondary | ICD-10-CM | POA: Diagnosis not present

## 2016-04-26 DIAGNOSIS — M545 Low back pain: Secondary | ICD-10-CM | POA: Diagnosis not present

## 2016-04-27 DIAGNOSIS — M6281 Muscle weakness (generalized): Secondary | ICD-10-CM | POA: Diagnosis not present

## 2016-04-27 DIAGNOSIS — M25661 Stiffness of right knee, not elsewhere classified: Secondary | ICD-10-CM | POA: Diagnosis not present

## 2016-04-27 DIAGNOSIS — M25561 Pain in right knee: Secondary | ICD-10-CM | POA: Diagnosis not present

## 2016-04-27 DIAGNOSIS — R262 Difficulty in walking, not elsewhere classified: Secondary | ICD-10-CM | POA: Diagnosis not present

## 2016-04-28 DIAGNOSIS — M2560 Stiffness of unspecified joint, not elsewhere classified: Secondary | ICD-10-CM | POA: Diagnosis not present

## 2016-04-28 DIAGNOSIS — R262 Difficulty in walking, not elsewhere classified: Secondary | ICD-10-CM | POA: Diagnosis not present

## 2016-04-28 DIAGNOSIS — M6281 Muscle weakness (generalized): Secondary | ICD-10-CM | POA: Diagnosis not present

## 2016-04-28 DIAGNOSIS — M5431 Sciatica, right side: Secondary | ICD-10-CM | POA: Diagnosis not present

## 2016-04-30 DIAGNOSIS — M25661 Stiffness of right knee, not elsewhere classified: Secondary | ICD-10-CM | POA: Diagnosis not present

## 2016-04-30 DIAGNOSIS — R262 Difficulty in walking, not elsewhere classified: Secondary | ICD-10-CM | POA: Diagnosis not present

## 2016-04-30 DIAGNOSIS — M25561 Pain in right knee: Secondary | ICD-10-CM | POA: Diagnosis not present

## 2016-04-30 DIAGNOSIS — M6281 Muscle weakness (generalized): Secondary | ICD-10-CM | POA: Diagnosis not present

## 2016-05-03 DIAGNOSIS — M25661 Stiffness of right knee, not elsewhere classified: Secondary | ICD-10-CM | POA: Diagnosis not present

## 2016-05-03 DIAGNOSIS — M6281 Muscle weakness (generalized): Secondary | ICD-10-CM | POA: Diagnosis not present

## 2016-05-03 DIAGNOSIS — M25561 Pain in right knee: Secondary | ICD-10-CM | POA: Diagnosis not present

## 2016-05-03 DIAGNOSIS — R262 Difficulty in walking, not elsewhere classified: Secondary | ICD-10-CM | POA: Diagnosis not present

## 2016-05-05 DIAGNOSIS — M6281 Muscle weakness (generalized): Secondary | ICD-10-CM | POA: Diagnosis not present

## 2016-05-05 DIAGNOSIS — M5431 Sciatica, right side: Secondary | ICD-10-CM | POA: Diagnosis not present

## 2016-05-05 DIAGNOSIS — M2560 Stiffness of unspecified joint, not elsewhere classified: Secondary | ICD-10-CM | POA: Diagnosis not present

## 2016-05-05 DIAGNOSIS — R262 Difficulty in walking, not elsewhere classified: Secondary | ICD-10-CM | POA: Diagnosis not present

## 2016-05-07 DIAGNOSIS — R262 Difficulty in walking, not elsewhere classified: Secondary | ICD-10-CM | POA: Diagnosis not present

## 2016-05-07 DIAGNOSIS — M25561 Pain in right knee: Secondary | ICD-10-CM | POA: Diagnosis not present

## 2016-05-07 DIAGNOSIS — M25661 Stiffness of right knee, not elsewhere classified: Secondary | ICD-10-CM | POA: Diagnosis not present

## 2016-05-07 DIAGNOSIS — M6281 Muscle weakness (generalized): Secondary | ICD-10-CM | POA: Diagnosis not present

## 2016-05-10 DIAGNOSIS — M25561 Pain in right knee: Secondary | ICD-10-CM | POA: Diagnosis not present

## 2016-05-10 DIAGNOSIS — R262 Difficulty in walking, not elsewhere classified: Secondary | ICD-10-CM | POA: Diagnosis not present

## 2016-05-10 DIAGNOSIS — M25661 Stiffness of right knee, not elsewhere classified: Secondary | ICD-10-CM | POA: Diagnosis not present

## 2016-05-10 DIAGNOSIS — M6281 Muscle weakness (generalized): Secondary | ICD-10-CM | POA: Diagnosis not present

## 2016-05-12 DIAGNOSIS — R262 Difficulty in walking, not elsewhere classified: Secondary | ICD-10-CM | POA: Diagnosis not present

## 2016-05-12 DIAGNOSIS — M5431 Sciatica, right side: Secondary | ICD-10-CM | POA: Diagnosis not present

## 2016-05-12 DIAGNOSIS — M2560 Stiffness of unspecified joint, not elsewhere classified: Secondary | ICD-10-CM | POA: Diagnosis not present

## 2016-05-12 DIAGNOSIS — M6281 Muscle weakness (generalized): Secondary | ICD-10-CM | POA: Diagnosis not present

## 2016-05-14 DIAGNOSIS — R262 Difficulty in walking, not elsewhere classified: Secondary | ICD-10-CM | POA: Diagnosis not present

## 2016-05-14 DIAGNOSIS — M6281 Muscle weakness (generalized): Secondary | ICD-10-CM | POA: Diagnosis not present

## 2016-05-14 DIAGNOSIS — M25561 Pain in right knee: Secondary | ICD-10-CM | POA: Diagnosis not present

## 2016-05-14 DIAGNOSIS — M25661 Stiffness of right knee, not elsewhere classified: Secondary | ICD-10-CM | POA: Diagnosis not present

## 2016-05-19 DIAGNOSIS — R262 Difficulty in walking, not elsewhere classified: Secondary | ICD-10-CM | POA: Diagnosis not present

## 2016-05-19 DIAGNOSIS — M25561 Pain in right knee: Secondary | ICD-10-CM | POA: Diagnosis not present

## 2016-05-19 DIAGNOSIS — M6281 Muscle weakness (generalized): Secondary | ICD-10-CM | POA: Diagnosis not present

## 2016-05-19 DIAGNOSIS — M25661 Stiffness of right knee, not elsewhere classified: Secondary | ICD-10-CM | POA: Diagnosis not present

## 2016-05-21 DIAGNOSIS — M25561 Pain in right knee: Secondary | ICD-10-CM | POA: Diagnosis not present

## 2016-05-21 DIAGNOSIS — M25661 Stiffness of right knee, not elsewhere classified: Secondary | ICD-10-CM | POA: Diagnosis not present

## 2016-05-21 DIAGNOSIS — M6281 Muscle weakness (generalized): Secondary | ICD-10-CM | POA: Diagnosis not present

## 2016-05-21 DIAGNOSIS — R262 Difficulty in walking, not elsewhere classified: Secondary | ICD-10-CM | POA: Diagnosis not present

## 2016-05-24 DIAGNOSIS — M25561 Pain in right knee: Secondary | ICD-10-CM | POA: Diagnosis not present

## 2016-05-26 DIAGNOSIS — M25561 Pain in right knee: Secondary | ICD-10-CM | POA: Diagnosis not present

## 2016-05-26 DIAGNOSIS — M6281 Muscle weakness (generalized): Secondary | ICD-10-CM | POA: Diagnosis not present

## 2016-05-26 DIAGNOSIS — R262 Difficulty in walking, not elsewhere classified: Secondary | ICD-10-CM | POA: Diagnosis not present

## 2016-05-26 DIAGNOSIS — M25661 Stiffness of right knee, not elsewhere classified: Secondary | ICD-10-CM | POA: Diagnosis not present

## 2016-05-28 DIAGNOSIS — M25561 Pain in right knee: Secondary | ICD-10-CM | POA: Diagnosis not present

## 2016-05-28 DIAGNOSIS — M6281 Muscle weakness (generalized): Secondary | ICD-10-CM | POA: Diagnosis not present

## 2016-05-28 DIAGNOSIS — M25661 Stiffness of right knee, not elsewhere classified: Secondary | ICD-10-CM | POA: Diagnosis not present

## 2016-05-28 DIAGNOSIS — R262 Difficulty in walking, not elsewhere classified: Secondary | ICD-10-CM | POA: Diagnosis not present

## 2016-07-05 DIAGNOSIS — M25561 Pain in right knee: Secondary | ICD-10-CM | POA: Diagnosis not present

## 2016-07-05 DIAGNOSIS — M1712 Unilateral primary osteoarthritis, left knee: Secondary | ICD-10-CM | POA: Diagnosis not present

## 2016-07-26 DIAGNOSIS — Z96659 Presence of unspecified artificial knee joint: Secondary | ICD-10-CM | POA: Diagnosis not present

## 2016-07-26 DIAGNOSIS — E039 Hypothyroidism, unspecified: Secondary | ICD-10-CM | POA: Diagnosis not present

## 2016-07-26 DIAGNOSIS — M170 Bilateral primary osteoarthritis of knee: Secondary | ICD-10-CM | POA: Diagnosis not present

## 2016-07-26 DIAGNOSIS — R8299 Other abnormal findings in urine: Secondary | ICD-10-CM | POA: Diagnosis not present

## 2016-07-26 DIAGNOSIS — E119 Type 2 diabetes mellitus without complications: Secondary | ICD-10-CM | POA: Diagnosis not present

## 2016-07-26 DIAGNOSIS — I10 Essential (primary) hypertension: Secondary | ICD-10-CM | POA: Diagnosis not present

## 2016-07-26 DIAGNOSIS — D509 Iron deficiency anemia, unspecified: Secondary | ICD-10-CM | POA: Diagnosis not present

## 2016-08-12 ENCOUNTER — Encounter (HOSPITAL_COMMUNITY): Payer: Self-pay | Admitting: *Deleted

## 2016-08-12 ENCOUNTER — Encounter (HOSPITAL_COMMUNITY)
Admission: RE | Admit: 2016-08-12 | Discharge: 2016-08-12 | Disposition: A | Discharge status: Home / Self Care | Payer: Medicare Other | Source: Ambulatory Visit | Attending: Orthopedic Surgery | Admitting: Orthopedic Surgery

## 2016-08-12 ENCOUNTER — Encounter (HOSPITAL_COMMUNITY): Payer: Self-pay

## 2016-08-12 DIAGNOSIS — F329 Major depressive disorder, single episode, unspecified: Secondary | ICD-10-CM | POA: Diagnosis not present

## 2016-08-12 DIAGNOSIS — E039 Hypothyroidism, unspecified: Secondary | ICD-10-CM | POA: Diagnosis not present

## 2016-08-12 DIAGNOSIS — E119 Type 2 diabetes mellitus without complications: Secondary | ICD-10-CM | POA: Diagnosis not present

## 2016-08-12 DIAGNOSIS — M1712 Unilateral primary osteoarthritis, left knee: Secondary | ICD-10-CM | POA: Diagnosis not present

## 2016-08-12 DIAGNOSIS — Z7984 Long term (current) use of oral hypoglycemic drugs: Secondary | ICD-10-CM | POA: Diagnosis not present

## 2016-08-12 DIAGNOSIS — E7800 Pure hypercholesterolemia, unspecified: Secondary | ICD-10-CM | POA: Diagnosis not present

## 2016-08-12 DIAGNOSIS — F419 Anxiety disorder, unspecified: Secondary | ICD-10-CM | POA: Diagnosis not present

## 2016-08-12 DIAGNOSIS — I10 Essential (primary) hypertension: Secondary | ICD-10-CM | POA: Diagnosis not present

## 2016-08-12 DIAGNOSIS — Z79899 Other long term (current) drug therapy: Secondary | ICD-10-CM | POA: Diagnosis not present

## 2016-08-12 DIAGNOSIS — Z01812 Encounter for preprocedural laboratory examination: Secondary | ICD-10-CM | POA: Diagnosis not present

## 2016-08-12 DIAGNOSIS — G4733 Obstructive sleep apnea (adult) (pediatric): Secondary | ICD-10-CM | POA: Diagnosis not present

## 2016-08-12 HISTORY — DX: Trigeminal neuralgia: G50.0

## 2016-08-12 LAB — BASIC METABOLIC PANEL
Anion gap: 8 (ref 5–15)
BUN: 25 mg/dL — AB (ref 6–20)
CALCIUM: 9.7 mg/dL (ref 8.9–10.3)
CHLORIDE: 105 mmol/L (ref 101–111)
CO2: 26 mmol/L (ref 22–32)
CREATININE: 1.06 mg/dL — AB (ref 0.44–1.00)
GFR calc Af Amer: >60 mL/min (ref >60)
GFR calc non Af Amer: 52 mL/min — ABNORMAL LOW (ref >60)
GLUCOSE: 182 mg/dL — AB (ref 65–99)
Potassium: 4.1 mmol/L (ref 3.5–5.1)
Sodium: 139 mmol/L (ref 135–145)

## 2016-08-12 LAB — CBC
HEMATOCRIT: 35.6 % — AB (ref 36.0–46.0)
HEMOGLOBIN: 11.7 g/dL — AB (ref 12.0–15.0)
MCH: 32 pg (ref 26.0–34.0)
MCHC: 32.9 g/dL (ref 30.0–36.0)
MCV: 97.3 fL (ref 78.0–100.0)
Platelets: 172 10*3/uL (ref 150–400)
RBC: 3.66 MIL/uL — ABNORMAL LOW (ref 3.87–5.11)
RDW: 13.5 % (ref 11.5–15.5)
WBC: 5.2 10*3/uL (ref 4.0–10.5)

## 2016-08-12 LAB — GLUCOSE, CAPILLARY: Glucose-Capillary: 224 mg/dL — ABNORMAL HIGH (ref 65–99)

## 2016-08-12 LAB — SURGICAL PCR SCREEN
MRSA, PCR: NEGATIVE
Staphylococcus aureus: NEGATIVE

## 2016-08-12 NOTE — Progress Notes (Signed)
   How to Manage Your Diabetes Before and After Surgery  Why is it important to control my blood sugar before and after surgery? . Improving blood sugar levels before and after surgery helps healing and can limit problems. . A way of improving blood sugar control is eating a healthy diet by: o  Eating less sugar and carbohydrates o  Increasing activity/exercise o  Talking with your doctor about reaching your blood sugar goals . High blood sugars (greater than 180 mg/dL) can raise your risk of infections and slow your recovery, so you will need to focus on controlling your diabetes during the weeks before surgery. . Make sure that the doctor who takes care of your diabetes knows about your planned surgery including the date and location.  How do I manage my blood sugar before surgery? . Check your blood sugar at least 4 times a day, starting 2 days before surgery, to make sure that the level is not too high or low. o Check your blood sugar the morning of your surgery when you wake up and every 2 hours until you get to the Short Stay unit. . If your blood sugar is less than 70 mg/dL, you will need to treat for low blood sugar: o Do not take insulin. o Treat a low blood sugar (less than 70 mg/dL) with  cup of clear juice (cranberry or apple), 4 glucose tablets, OR glucose gel. o Recheck blood sugar in 15 minutes after treatment (to make sure it is greater than 70 mg/dL). If your blood sugar is not greater than 70 mg/dL on recheck, call [phone] for further instructions. . Report your blood sugar to the short stay nurse when you get to Short Stay.  . If you are admitted to the hospital after surgery: o Your blood sugar will be checked by the staff and you will probably be given insulin after surgery (instead of oral diabetes medicines) to make sure you have good blood sugar levels. o The goal for blood sugar control after surgery is 80-180 mg/dL.  WHAT DO I DO ABOUT MY DIABETES  MEDICATION?  . Do not take oral diabetes medicines (pills) the morning of surgery.   Patient Signature:  Date:   Nurse Signature:  Date:   Reviewed and Endorsed by [place] Patient Education Committee, August 2015 

## 2016-08-12 NOTE — Pre-Procedure Instructions (Signed)
    Kathy Young  08/12/2016     Your procedure is scheduled on Tuesday, September 5.  [facility] at 5:30 A.M.               Your surgery or procedure is scheduled for 7:30 AM   Call this number if you have problems the morning of [phone]                  For any other questions, please call [phone], Monday - Friday 8 AM - 4 PM.   Remember:  Do not eat food or drink liquids after midnight Monday, September 4  Take these medicines the morning of surgery with A SIP OF WATER:escitalopram (LEXAPRO), gabapentin (NEURONTIN), levothyroxine (SYNTHROID, LEVOTHROID).               DO NOT TAKE medication for diabetes the morning of surgery.               Take if needed:traMADol [name](ULTRAM).    Do not wear jewelry, make-up or nail polish.  Do not wear lotions, powders, or perfumes, or deodorant.  Do not shave 48 hours prior to surgery.  Do not bring valuables to the hospital.  [facility] is not [facility] for any belongings or valuables.  Contacts, dentures or bridgework may not be worn into surgery.  Leave your suitcase in the car.  After surgery it may be brought to your room.  For patients admitted to the hospital, discharge time will be determined by your treatment team.  Special instructions: Review  [name].  Please read over the following fact sheets that you were given, Incentive Spirometry, How to Manage Diabetes before and After Surgery.

## 2016-08-12 NOTE — Progress Notes (Addendum)
Kathy Young was diagnosed > 10 years ago with sleep apnea, unable to use CPAP due to Trigeminal Neuralgia.  Patient denies chest pain or Shortness of breath.  Kathy Young reports thatt fasting CBG runs in low 100's.

## 2016-08-13 ENCOUNTER — Inpatient Hospital Stay (HOSPITAL_COMMUNITY): Admission: RE | Admit: 2016-08-13 | Payer: Self-pay | Source: Ambulatory Visit

## 2016-08-13 LAB — HEMOGLOBIN A1C
Hgb A1c MFr Bld: 5.8 % — ABNORMAL HIGH (ref 4.8–5.6)
MEAN PLASMA GLUCOSE: 120 mg/dL

## 2016-08-20 MED ORDER — DEXTROSE 5 % IV SOLN
3.0000 g | INTRAVENOUS | Status: AC
  Administered 2016-08-24: 3 g via INTRAVENOUS
  Filled 2016-08-20: qty 3000

## 2016-08-23 NOTE — Anesthesia Preprocedure Evaluation (Addendum)
Anesthesia Evaluation  Patient identified by MRN, date of birth, ID band Patient awake    Reviewed: Allergy & Precautions, NPO status , Patient's Chart, lab work & pertinent test results  Airway Mallampati: III  TM Distance: >3 FB Neck ROM: Full    Dental  (+) Teeth Intact   Pulmonary shortness of breath, sleep apnea ,    breath sounds clear to auscultation       Cardiovascular hypertension, Pt. on medications  Rhythm:Regular Rate:Normal     Neuro/Psych Seizures -,  PSYCHIATRIC DISORDERS Anxiety Depression  Neuromuscular disease    GI/Hepatic negative GI ROS, Neg liver ROS,   Endo/Other  diabetes, Type 2, Oral Hypoglycemic AgentsHypothyroidism Morbid obesity  Renal/GU negative Renal ROS  negative genitourinary   Musculoskeletal  (+) Arthritis , Osteoarthritis,    Abdominal   Peds  Hematology  (+) anemia ,   Anesthesia Other Findings   Reproductive/Obstetrics negative OB ROS                             Lab Results  Component Value Date   WBC 5.2 08/12/2016   HGB 11.7 (L) 08/12/2016   HCT 35.6 (L) 08/12/2016   MCV 97.3 08/12/2016   PLT 172 08/12/2016   Lab Results  Component Value Date   CREATININE 1.06 (H) 08/12/2016   BUN 25 (H) 08/12/2016   NA 139 08/12/2016   K 4.1 08/12/2016   CL 105 08/12/2016   CO2 26 08/12/2016   Lab Results  Component Value Date   INR 1.00 07/28/2011   INR 1.01 10/13/2010   08/2015 EKG: NSR    Anesthesia Physical  Anesthesia Plan  ASA: III  Anesthesia Plan: Spinal   Post-op Pain Management:  Regional for Post-op pain   Induction: Intravenous  Airway Management Planned: Mask and Natural Airway  Additional Equipment:   Intra-op Plan:   Post-operative Plan:   Informed Consent: I have reviewed the patients History and Physical, chart, labs and discussed the procedure including the risks, benefits and alternatives for the proposed  anesthesia with the patient or authorized representative who has indicated his/her understanding and acceptance.   Dental advisory given  Plan Discussed with: CRNA  Anesthesia Plan Comments:        Anesthesia Quick Evaluation

## 2016-08-24 ENCOUNTER — Inpatient Hospital Stay (HOSPITAL_COMMUNITY): Payer: Medicare Other | Admitting: Anesthesiology

## 2016-08-24 ENCOUNTER — Encounter (HOSPITAL_COMMUNITY)
Admission: RE | Disposition: A | Discharge status: Skilled Nursing Facility | Payer: Self-pay | Source: Ambulatory Visit | Attending: Orthopedic Surgery

## 2016-08-24 ENCOUNTER — Inpatient Hospital Stay (HOSPITAL_COMMUNITY): Payer: Medicare Other

## 2016-08-24 ENCOUNTER — Inpatient Hospital Stay (HOSPITAL_COMMUNITY)
Admission: RE | Admit: 2016-08-24 | Discharge: 2016-08-26 | DRG: 470 | Disposition: A | Discharge status: Skilled Nursing Facility | Payer: Medicare Other | Source: Ambulatory Visit | Attending: Orthopedic Surgery | Admitting: Orthopedic Surgery

## 2016-08-24 ENCOUNTER — Encounter (HOSPITAL_COMMUNITY): Payer: Self-pay | Admitting: Orthopedic Surgery

## 2016-08-24 DIAGNOSIS — M1712 Unilateral primary osteoarthritis, left knee: Secondary | ICD-10-CM | POA: Diagnosis not present

## 2016-08-24 DIAGNOSIS — G8918 Other acute postprocedural pain: Secondary | ICD-10-CM | POA: Diagnosis not present

## 2016-08-24 DIAGNOSIS — M25562 Pain in left knee: Secondary | ICD-10-CM | POA: Diagnosis not present

## 2016-08-24 DIAGNOSIS — E662 Morbid (severe) obesity with alveolar hypoventilation: Secondary | ICD-10-CM | POA: Diagnosis present

## 2016-08-24 DIAGNOSIS — F329 Major depressive disorder, single episode, unspecified: Secondary | ICD-10-CM | POA: Diagnosis present

## 2016-08-24 DIAGNOSIS — F419 Anxiety disorder, unspecified: Secondary | ICD-10-CM | POA: Diagnosis present

## 2016-08-24 DIAGNOSIS — E559 Vitamin D deficiency, unspecified: Secondary | ICD-10-CM | POA: Diagnosis present

## 2016-08-24 DIAGNOSIS — K219 Gastro-esophageal reflux disease without esophagitis: Secondary | ICD-10-CM | POA: Diagnosis present

## 2016-08-24 DIAGNOSIS — Z6841 Body Mass Index (BMI) 40.0 and over, adult: Secondary | ICD-10-CM | POA: Diagnosis not present

## 2016-08-24 DIAGNOSIS — R278 Other lack of coordination: Secondary | ICD-10-CM | POA: Diagnosis not present

## 2016-08-24 DIAGNOSIS — Z471 Aftercare following joint replacement surgery: Secondary | ICD-10-CM | POA: Diagnosis not present

## 2016-08-24 DIAGNOSIS — M179 Osteoarthritis of knee, unspecified: Secondary | ICD-10-CM | POA: Diagnosis not present

## 2016-08-24 DIAGNOSIS — Z9119 Patient's noncompliance with other medical treatment and regimen: Secondary | ICD-10-CM | POA: Diagnosis not present

## 2016-08-24 DIAGNOSIS — Z96652 Presence of left artificial knee joint: Secondary | ICD-10-CM | POA: Diagnosis not present

## 2016-08-24 DIAGNOSIS — R2681 Unsteadiness on feet: Secondary | ICD-10-CM | POA: Diagnosis not present

## 2016-08-24 DIAGNOSIS — E7800 Pure hypercholesterolemia, unspecified: Secondary | ICD-10-CM | POA: Diagnosis present

## 2016-08-24 DIAGNOSIS — I10 Essential (primary) hypertension: Secondary | ICD-10-CM | POA: Diagnosis present

## 2016-08-24 DIAGNOSIS — E119 Type 2 diabetes mellitus without complications: Secondary | ICD-10-CM | POA: Diagnosis present

## 2016-08-24 DIAGNOSIS — E039 Hypothyroidism, unspecified: Secondary | ICD-10-CM | POA: Diagnosis present

## 2016-08-24 DIAGNOSIS — M6281 Muscle weakness (generalized): Secondary | ICD-10-CM | POA: Diagnosis not present

## 2016-08-24 DIAGNOSIS — G500 Trigeminal neuralgia: Secondary | ICD-10-CM | POA: Diagnosis present

## 2016-08-24 DIAGNOSIS — Z7984 Long term (current) use of oral hypoglycemic drugs: Secondary | ICD-10-CM | POA: Diagnosis not present

## 2016-08-24 DIAGNOSIS — Z96651 Presence of right artificial knee joint: Secondary | ICD-10-CM | POA: Diagnosis present

## 2016-08-24 DIAGNOSIS — Z96659 Presence of unspecified artificial knee joint: Secondary | ICD-10-CM

## 2016-08-24 DIAGNOSIS — M1711 Unilateral primary osteoarthritis, right knee: Secondary | ICD-10-CM | POA: Diagnosis present

## 2016-08-24 HISTORY — DX: Other complications of anesthesia, initial encounter: T88.59XA

## 2016-08-24 HISTORY — DX: Unilateral primary osteoarthritis, left knee: M17.12

## 2016-08-24 HISTORY — DX: Adverse effect of unspecified anesthetic, initial encounter: T41.45XA

## 2016-08-24 HISTORY — PX: TOTAL KNEE ARTHROPLASTY: SHX125

## 2016-08-24 LAB — GLUCOSE, CAPILLARY
GLUCOSE-CAPILLARY: 90 mg/dL (ref 65–99)
GLUCOSE-CAPILLARY: 133 mg/dL — AB (ref 65–99)
GLUCOSE-CAPILLARY: 75 mg/dL (ref 65–99)
Glucose-Capillary: 99 mg/dL (ref 65–99)

## 2016-08-24 SURGERY — ARTHROPLASTY, KNEE, TOTAL
Anesthesia: General | Site: Knee | Laterality: Left

## 2016-08-24 MED ORDER — ONDANSETRON HCL 4 MG/2ML IJ SOLN
INTRAMUSCULAR | Status: DC | PRN
  Administered 2016-08-24: 4 mg via INTRAVENOUS

## 2016-08-24 MED ORDER — POLYETHYLENE GLYCOL 3350 17 G PO PACK: 17.0000 g | PACK | Freq: Every day | ORAL | Status: DC | PRN

## 2016-08-24 MED ORDER — FENTANYL CITRATE (PF) 100 MCG/2ML IJ SOLN
INTRAMUSCULAR | Status: AC
  Filled 2016-08-24: qty 2

## 2016-08-24 MED ORDER — DOCUSATE SODIUM 100 MG PO CAPS
100.0000 mg | ORAL_CAPSULE | Freq: Two times a day (BID) | ORAL | Status: DC
  Administered 2016-08-24 – 2016-08-26 (×5): 100 mg via ORAL
  Filled 2016-08-24 (×5): qty 1

## 2016-08-24 MED ORDER — VITAMIN D 1000 UNITS PO TABS
1000.0000 [IU] | ORAL_TABLET | Freq: Every day | ORAL | Status: DC
  Administered 2016-08-24 – 2016-08-26 (×3): 1000 [IU] via ORAL
  Filled 2016-08-24 (×3): qty 1

## 2016-08-24 MED ORDER — SODIUM CHLORIDE 0.9 % IR SOLN
Status: DC | PRN
  Administered 2016-08-24: 3000 mL

## 2016-08-24 MED ORDER — PROPOFOL 10 MG/ML IV BOLUS
INTRAVENOUS | Status: DC | PRN
  Administered 2016-08-24 (×3): 20 mg via INTRAVENOUS

## 2016-08-24 MED ORDER — GABAPENTIN 400 MG PO CAPS
400.0000 mg | ORAL_CAPSULE | Freq: Two times a day (BID) | ORAL | Status: DC
  Administered 2016-08-24 – 2016-08-26 (×5): 400 mg via ORAL
  Filled 2016-08-24 (×5): qty 1

## 2016-08-24 MED ORDER — PROMETHAZINE HCL 25 MG/ML IJ SOLN: 6.2500 mg | INTRAMUSCULAR | Status: DC | PRN

## 2016-08-24 MED ORDER — OXYCODONE HCL 5 MG PO TABS
5.0000 mg | ORAL_TABLET | ORAL | Status: DC | PRN
  Administered 2016-08-24: 10 mg via ORAL
  Administered 2016-08-24: 5 mg via ORAL
  Administered 2016-08-24 – 2016-08-26 (×11): 10 mg via ORAL
  Filled 2016-08-24 (×12): qty 2

## 2016-08-24 MED ORDER — METFORMIN HCL ER 500 MG PO TB24
500.0000 mg | ORAL_TABLET | Freq: Two times a day (BID) | ORAL | Status: DC
  Administered 2016-08-24 – 2016-08-26 (×5): 500 mg via ORAL
  Filled 2016-08-24 (×5): qty 1

## 2016-08-24 MED ORDER — KETOROLAC TROMETHAMINE 15 MG/ML IJ SOLN
INTRAMUSCULAR | Status: AC
  Filled 2016-08-24: qty 1

## 2016-08-24 MED ORDER — BACLOFEN 10 MG PO TABS: 10.0000 mg | ORAL_TABLET | Freq: Three times a day (TID) | ORAL | 0 refills | Status: AC

## 2016-08-24 MED ORDER — HYDROMORPHONE HCL 1 MG/ML IJ SOLN
0.5000 mg | INTRAMUSCULAR | Status: DC | PRN
  Administered 2016-08-24 – 2016-08-25 (×3): 0.5 mg via INTRAVENOUS
  Filled 2016-08-24 (×3): qty 1

## 2016-08-24 MED ORDER — METOCLOPRAMIDE HCL 5 MG PO TABS: 5.0000 mg | ORAL_TABLET | Freq: Three times a day (TID) | ORAL | Status: DC | PRN

## 2016-08-24 MED ORDER — ONDANSETRON HCL 4 MG PO TABS: 4.0000 mg | ORAL_TABLET | Freq: Four times a day (QID) | ORAL | Status: DC | PRN

## 2016-08-24 MED ORDER — BISACODYL 10 MG RE SUPP: 10.0000 mg | Freq: Every day | RECTAL | Status: DC | PRN

## 2016-08-24 MED ORDER — SENNA 8.6 MG PO TABS
1.0000 | ORAL_TABLET | Freq: Two times a day (BID) | ORAL | Status: DC
  Administered 2016-08-24 – 2016-08-26 (×5): 8.6 mg via ORAL
  Filled 2016-08-24 (×4): qty 1

## 2016-08-24 MED ORDER — MENTHOL 3 MG MT LOZG
1.0000 | LOZENGE | OROMUCOSAL | Status: DC | PRN
Start: 2016-08-24 — End: 2016-08-26

## 2016-08-24 MED ORDER — PROPOFOL 500 MG/50ML IV EMUL
INTRAVENOUS | Status: DC | PRN
  Administered 2016-08-24: 100 ug/kg/min via INTRAVENOUS
  Administered 2016-08-24: 09:00:00 via INTRAVENOUS

## 2016-08-24 MED ORDER — KETOROLAC TROMETHAMINE 15 MG/ML IJ SOLN
7.5000 mg | Freq: Four times a day (QID) | INTRAMUSCULAR | Status: AC
  Administered 2016-08-24 – 2016-08-25 (×3): 7.5 mg via INTRAVENOUS
  Filled 2016-08-24 (×2): qty 1

## 2016-08-24 MED ORDER — BACLOFEN 10 MG PO TABS: 10.0000 mg | ORAL_TABLET | Freq: Three times a day (TID) | ORAL | Status: DC | PRN

## 2016-08-24 MED ORDER — LEVOTHYROXINE SODIUM 100 MCG PO TABS
200.0000 ug | ORAL_TABLET | Freq: Every day | ORAL | Status: DC
  Administered 2016-08-25 – 2016-08-26 (×2): 200 ug via ORAL
  Filled 2016-08-24 (×2): qty 2

## 2016-08-24 MED ORDER — BUPIVACAINE-EPINEPHRINE (PF) 0.5% -1:200000 IJ SOLN
INTRAMUSCULAR | Status: DC | PRN
  Administered 2016-08-24: 20 mL via PERINEURAL

## 2016-08-24 MED ORDER — POTASSIUM CHLORIDE IN NACL 20-0.45 MEQ/L-% IV SOLN
INTRAVENOUS | Status: DC
  Administered 2016-08-24: 18:00:00 via INTRAVENOUS
  Filled 2016-08-24 (×2): qty 1000

## 2016-08-24 MED ORDER — ONDANSETRON HCL 4 MG PO TABS: 4.0000 mg | ORAL_TABLET | Freq: Three times a day (TID) | ORAL | 0 refills | Status: AC | PRN

## 2016-08-24 MED ORDER — METHOCARBAMOL 500 MG PO TABS
500.0000 mg | ORAL_TABLET | Freq: Four times a day (QID) | ORAL | Status: DC | PRN
  Administered 2016-08-24 – 2016-08-26 (×6): 500 mg via ORAL
  Filled 2016-08-24 (×6): qty 1

## 2016-08-24 MED ORDER — ADULT MULTIVITAMIN W/MINERALS CH
1.0000 | ORAL_TABLET | Freq: Every day | ORAL | Status: DC
  Administered 2016-08-24 – 2016-08-26 (×3): 1 via ORAL
  Filled 2016-08-24 (×3): qty 1

## 2016-08-24 MED ORDER — DIPHENHYDRAMINE HCL 12.5 MG/5ML PO ELIX: 12.5000 mg | ORAL_SOLUTION | ORAL | Status: DC | PRN

## 2016-08-24 MED ORDER — OXYCODONE-ACETAMINOPHEN 10-325 MG PO TABS: 1.0000 | ORAL_TABLET | Freq: Four times a day (QID) | ORAL | 0 refills | Status: AC | PRN

## 2016-08-24 MED ORDER — ONDANSETRON HCL 4 MG/2ML IJ SOLN
INTRAMUSCULAR | Status: AC
  Filled 2016-08-24: qty 2

## 2016-08-24 MED ORDER — FENTANYL CITRATE (PF) 100 MCG/2ML IJ SOLN
25.0000 ug | INTRAMUSCULAR | Status: DC | PRN
  Administered 2016-08-24 (×4): 25 ug via INTRAVENOUS

## 2016-08-24 MED ORDER — OXYCODONE HCL 5 MG PO TABS
ORAL_TABLET | ORAL | Status: AC
  Filled 2016-08-24: qty 1

## 2016-08-24 MED ORDER — SENNA-DOCUSATE SODIUM 8.6-50 MG PO TABS: 2.0000 | ORAL_TABLET | Freq: Every day | ORAL | 1 refills | Status: AC

## 2016-08-24 MED ORDER — METHOCARBAMOL 1000 MG/10ML IJ SOLN
500.0000 mg | Freq: Four times a day (QID) | INTRAVENOUS | Status: DC | PRN
  Filled 2016-08-24: qty 5

## 2016-08-24 MED ORDER — PHENYLEPHRINE HCL 10 MG/ML IJ SOLN
INTRAMUSCULAR | Status: AC
  Filled 2016-08-24: qty 1

## 2016-08-24 MED ORDER — PROPOFOL 10 MG/ML IV BOLUS
INTRAVENOUS | Status: AC
  Filled 2016-08-24: qty 40

## 2016-08-24 MED ORDER — ATORVASTATIN CALCIUM 10 MG PO TABS
10.0000 mg | ORAL_TABLET | Freq: Every day | ORAL | Status: DC
  Administered 2016-08-24 – 2016-08-26 (×3): 10 mg via ORAL
  Filled 2016-08-24 (×3): qty 1

## 2016-08-24 MED ORDER — MIDAZOLAM HCL 2 MG/2ML IJ SOLN
INTRAMUSCULAR | Status: AC
  Filled 2016-08-24: qty 2

## 2016-08-24 MED ORDER — INSULIN ASPART 100 UNIT/ML ~~LOC~~ SOLN
0.0000 [IU] | Freq: Three times a day (TID) | SUBCUTANEOUS | Status: DC
  Administered 2016-08-24: 2 [IU] via SUBCUTANEOUS
  Administered 2016-08-25 (×3): 3 [IU] via SUBCUTANEOUS
  Administered 2016-08-26: 2 [IU] via SUBCUTANEOUS

## 2016-08-24 MED ORDER — METOCLOPRAMIDE HCL 5 MG/ML IJ SOLN: 5.0000 mg | Freq: Three times a day (TID) | INTRAMUSCULAR | Status: DC | PRN

## 2016-08-24 MED ORDER — LIDOCAINE 2% (20 MG/ML) 5 ML SYRINGE
INTRAMUSCULAR | Status: AC
  Filled 2016-08-24: qty 5

## 2016-08-24 MED ORDER — ACETAMINOPHEN 650 MG RE SUPP: 650.0000 mg | Freq: Four times a day (QID) | RECTAL | Status: DC | PRN

## 2016-08-24 MED ORDER — DEXAMETHASONE SODIUM PHOSPHATE 10 MG/ML IJ SOLN
10.0000 mg | Freq: Once | INTRAMUSCULAR | Status: AC
  Administered 2016-08-25: 10 mg via INTRAVENOUS
  Filled 2016-08-24: qty 1

## 2016-08-24 MED ORDER — OXYBUTYNIN CHLORIDE ER 15 MG PO TB24
15.0000 mg | ORAL_TABLET | Freq: Every morning | ORAL | Status: DC
  Administered 2016-08-25 – 2016-08-26 (×2): 15 mg via ORAL
  Filled 2016-08-24 (×3): qty 1

## 2016-08-24 MED ORDER — PHENOL 1.4 % MT LIQD: 1.0000 | OROMUCOSAL | Status: DC | PRN

## 2016-08-24 MED ORDER — BUPIVACAINE IN DEXTROSE 0.75-8.25 % IT SOLN
INTRATHECAL | Status: DC | PRN
  Administered 2016-08-24: 1.6 mL via INTRATHECAL

## 2016-08-24 MED ORDER — LISINOPRIL 20 MG PO TABS
20.0000 mg | ORAL_TABLET | Freq: Every day | ORAL | Status: DC
  Administered 2016-08-25 – 2016-08-26 (×2): 20 mg via ORAL
  Filled 2016-08-24 (×2): qty 1

## 2016-08-24 MED ORDER — ALUM & MAG HYDROXIDE-SIMETH 200-200-20 MG/5ML PO SUSP: 30.0000 mL | ORAL | Status: DC | PRN

## 2016-08-24 MED ORDER — ACETAMINOPHEN 325 MG PO TABS
650.0000 mg | ORAL_TABLET | Freq: Four times a day (QID) | ORAL | Status: DC | PRN
Start: 2016-08-24 — End: 2016-08-26
  Administered 2016-08-25: 650 mg via ORAL
  Filled 2016-08-24: qty 2

## 2016-08-24 MED ORDER — CEFAZOLIN SODIUM-DEXTROSE 2-4 GM/100ML-% IV SOLN
2.0000 g | Freq: Four times a day (QID) | INTRAVENOUS | Status: AC
  Administered 2016-08-24 (×2): 2 g via INTRAVENOUS
  Filled 2016-08-24 (×2): qty 100

## 2016-08-24 MED ORDER — ONDANSETRON HCL 4 MG/2ML IJ SOLN: 4.0000 mg | Freq: Four times a day (QID) | INTRAMUSCULAR | Status: DC | PRN

## 2016-08-24 MED ORDER — MIDAZOLAM HCL 5 MG/5ML IJ SOLN
INTRAMUSCULAR | Status: DC | PRN
  Administered 2016-08-24: 2 mg via INTRAVENOUS

## 2016-08-24 MED ORDER — RIVAROXABAN 10 MG PO TABS
10.0000 mg | ORAL_TABLET | Freq: Every day | ORAL | Status: DC
  Administered 2016-08-25 – 2016-08-26 (×2): 10 mg via ORAL
  Filled 2016-08-24 (×2): qty 1

## 2016-08-24 MED ORDER — LIDOCAINE 2% (20 MG/ML) 5 ML SYRINGE
INTRAMUSCULAR | Status: DC | PRN
  Administered 2016-08-24 (×3): 20 mg via INTRAVENOUS

## 2016-08-24 MED ORDER — RIVAROXABAN 10 MG PO TABS: 10.0000 mg | ORAL_TABLET | Freq: Every day | ORAL | 0 refills | Status: AC

## 2016-08-24 MED ORDER — MAGNESIUM CITRATE PO SOLN: 1.0000 | Freq: Once | ORAL | Status: DC | PRN

## 2016-08-24 MED ORDER — LACTATED RINGERS IV SOLN
INTRAVENOUS | Status: DC | PRN
  Administered 2016-08-24 (×2): via INTRAVENOUS

## 2016-08-24 MED ORDER — METHOCARBAMOL 500 MG PO TABS
ORAL_TABLET | ORAL | Status: AC
  Filled 2016-08-24: qty 1

## 2016-08-24 MED ORDER — PROPOFOL 1000 MG/100ML IV EMUL
INTRAVENOUS | Status: AC
  Filled 2016-08-24: qty 300

## 2016-08-24 MED ORDER — ESCITALOPRAM OXALATE 10 MG PO TABS
20.0000 mg | ORAL_TABLET | Freq: Every day | ORAL | Status: DC
  Administered 2016-08-24 – 2016-08-25 (×2): 20 mg via ORAL
  Filled 2016-08-24 (×2): qty 2

## 2016-08-24 MED ORDER — TRAMADOL HCL 50 MG PO TABS
50.0000 mg | ORAL_TABLET | Freq: Four times a day (QID) | ORAL | Status: DC | PRN
Start: 2016-08-24 — End: 2016-08-26
  Administered 2016-08-24 (×2): 50 mg via ORAL
  Filled 2016-08-24 (×2): qty 1

## 2016-08-24 MED ORDER — PHENYLEPHRINE HCL 10 MG/ML IJ SOLN
INTRAMUSCULAR | Status: DC | PRN
  Administered 2016-08-24: 20 ug/min via INTRAVENOUS

## 2016-08-24 MED ORDER — ROCURONIUM BROMIDE 10 MG/ML (PF) SYRINGE
PREFILLED_SYRINGE | INTRAVENOUS | Status: AC
  Filled 2016-08-24: qty 10

## 2016-08-24 MED ORDER — VALACYCLOVIR HCL 500 MG PO TABS
500.0000 mg | ORAL_TABLET | Freq: Every day | ORAL | Status: DC
  Administered 2016-08-24 – 2016-08-26 (×3): 500 mg via ORAL
  Filled 2016-08-24 (×3): qty 1

## 2016-08-24 SURGICAL SUPPLY — 62 items
BANDAGE ACE 6X5 VEL STRL LF (GAUZE/BANDAGES/DRESSINGS) ×3 IMPLANT
BANDAGE ESMARK 6X9 LF (GAUZE/BANDAGES/DRESSINGS) ×1 IMPLANT
BENZOIN TINCTURE PRP APPL 2/3 (GAUZE/BANDAGES/DRESSINGS) ×3 IMPLANT
BLADE SAG 18X100X1.27 (BLADE) ×3 IMPLANT
BLADE SAW SGTL 13X75X1.27 (BLADE) ×3 IMPLANT
BNDG ESMARK 6X9 LF (GAUZE/BANDAGES/DRESSINGS) ×3
BOOTCOVER CLEANROOM LRG (PROTECTIVE WEAR) ×6 IMPLANT
BOWL SMART MIX CTS (DISPOSABLE) ×3 IMPLANT
CAP KNEE TOTAL 3 SIGMA ×3 IMPLANT
CEMENT HV SMART SET (Cement) ×6 IMPLANT
CLOSURE STERI-STRIP 1/2X4 (GAUZE/BANDAGES/DRESSINGS) ×1
CLSR STERI-STRIP ANTIMIC 1/2X4 (GAUZE/BANDAGES/DRESSINGS) ×2 IMPLANT
COVER SURGICAL LIGHT HANDLE (MISCELLANEOUS) ×3 IMPLANT
CUFF TOURNIQUET SINGLE 34IN LL (TOURNIQUET CUFF) ×3 IMPLANT
DRAPE EXTREMITY T 121X128X90 (DRAPE) ×3 IMPLANT
DRAPE U-SHAPE 47X51 STRL (DRAPES) ×3 IMPLANT
DRSG PAD ABDOMINAL 8X10 ST (GAUZE/BANDAGES/DRESSINGS) ×3 IMPLANT
DURAPREP 26ML APPLICATOR (WOUND CARE) ×3 IMPLANT
ELECT CAUTERY BLADE 6.4 (BLADE) ×3 IMPLANT
ELECT REM PT RETURN 9FT ADLT (ELECTROSURGICAL) ×3
ELECTRODE REM PT RTRN 9FT ADLT (ELECTROSURGICAL) ×1 IMPLANT
FACESHIELD STD STERILE (MASK) ×3 IMPLANT
GAUZE SPONGE 4X4 12PLY STRL (GAUZE/BANDAGES/DRESSINGS) ×3 IMPLANT
GLOVE BIOGEL PI IND STRL 8 (GLOVE) ×1 IMPLANT
GLOVE BIOGEL PI INDICATOR 8 (GLOVE) ×2
GLOVE BIOGEL PI ORTHO PRO SZ8 (GLOVE) ×2
GLOVE ORTHO TXT STRL SZ7.5 (GLOVE) ×3 IMPLANT
GLOVE PI ORTHO PRO STRL SZ8 (GLOVE) ×1 IMPLANT
GLOVE SURG ORTHO 8.0 STRL STRW (GLOVE) ×3 IMPLANT
GOWN STRL REUS W/ TWL XL LVL3 (GOWN DISPOSABLE) ×1 IMPLANT
GOWN STRL REUS W/TWL 2XL LVL3 (GOWN DISPOSABLE) ×3 IMPLANT
GOWN STRL REUS W/TWL XL LVL3 (GOWN DISPOSABLE) ×2
HANDPIECE INTERPULSE COAX TIP (DISPOSABLE) ×2
HOOD PEEL AWAY FACE SHEILD DIS (HOOD) ×6 IMPLANT
IMMOBILIZER KNEE 22 (SOFTGOODS) ×3 IMPLANT
KIT BASIN OR (CUSTOM PROCEDURE TRAY) ×3 IMPLANT
KIT ROOM TURNOVER OR (KITS) ×3 IMPLANT
MANIFOLD NEPTUNE II (INSTRUMENTS) ×3 IMPLANT
NEEDLE 18GX1X1/2 (RX/OR ONLY) (NEEDLE) ×3 IMPLANT
NS IRRIG 1000ML POUR BTL (IV SOLUTION) ×3 IMPLANT
PACK TOTAL JOINT (CUSTOM PROCEDURE TRAY) ×3 IMPLANT
PAD ABD 8X10 STRL (GAUZE/BANDAGES/DRESSINGS) ×3 IMPLANT
PAD ARMBOARD 7.5X6 YLW CONV (MISCELLANEOUS) ×6 IMPLANT
PAD CAST 4YDX4 CTTN HI CHSV (CAST SUPPLIES) ×1 IMPLANT
PADDING CAST COTTON 4X4 STRL (CAST SUPPLIES) ×2
PADDING CAST COTTON 6X4 STRL (CAST SUPPLIES) ×3 IMPLANT
SET HNDPC FAN SPRY TIP SCT (DISPOSABLE) ×1 IMPLANT
SPONGE GAUZE 4X4 12PLY STER LF (GAUZE/BANDAGES/DRESSINGS) ×3 IMPLANT
SUCTION FRAZIER HANDLE 10FR (MISCELLANEOUS) ×2
SUCTION TUBE FRAZIER 10FR DISP (MISCELLANEOUS) ×1 IMPLANT
SUT MNCRL AB 4-0 PS2 18 (SUTURE) IMPLANT
SUT VIC AB 0 CT1 27 (SUTURE) ×2
SUT VIC AB 0 CT1 27XBRD ANBCTR (SUTURE) ×1 IMPLANT
SUT VIC AB 2-0 CT1 27 (SUTURE) ×4
SUT VIC AB 2-0 CT1 TAPERPNT 27 (SUTURE) ×2 IMPLANT
SUT VIC AB 3-0 SH 8-18 (SUTURE) ×6 IMPLANT
SYR 30ML LL (SYRINGE) IMPLANT
SYR 50ML LL SCALE MARK (SYRINGE) ×3 IMPLANT
TOWEL OR 17X24 6PK STRL BLUE (TOWEL DISPOSABLE) ×3 IMPLANT
TOWEL OR 17X26 10 PK STRL BLUE (TOWEL DISPOSABLE) ×3 IMPLANT
TRAY CATH 16FR W/PLASTIC CATH (SET/KITS/TRAYS/PACK) IMPLANT
WATER STERILE IRR 1000ML POUR (IV SOLUTION) ×6 IMPLANT

## 2016-08-24 NOTE — Anesthesia Postprocedure Evaluation (Signed)
Anesthesia Post Note  Patient: Sabrinna E Kukla  Procedure(s) Performed: Procedure(s) (LRB): TOTAL KNEE ARTHROPLASTY (Left)  Patient location during evaluation: PACU Anesthesia Type: Spinal Level of consciousness: oriented and awake and alert Pain management: pain level controlled Vital Signs Assessment: post-procedure vital signs reviewed and stable Respiratory status: spontaneous breathing, respiratory function stable and patient connected to nasal cannula oxygen Cardiovascular status: blood pressure returned to baseline and stable Postop Assessment: no headache and no backache Anesthetic complications: no    Last Vitals:  Vitals:   08/24/16 1140 08/24/16 1158  BP: (!) 89/58   Pulse: (!) 59 71  Resp: 12 20  Temp:      Last Pain:  Vitals:   08/24/16 1158  TempSrc:   PainSc: 6                  Ruthann Angulo Astrid DivineS Nadja Lina

## 2016-08-24 NOTE — Anesthesia Procedure Notes (Addendum)
Anesthesia Regional Block:  Adductor canal block  Pre-Anesthetic Checklist: ,, timeout performed, Correct Patient, Correct Site, Correct Laterality, Correct Procedure, Correct Position, site marked, Risks and benefits discussed,  Surgical consent,  Pre-op evaluation,  At surgeon's request and post-op pain management  Laterality: Left  Prep: chloraprep       Needles:   Needle Type: Echogenic Needle     Needle Length: 5cm 5 cm Needle Gauge: 22 and 22 G    Additional Needles:  Procedures: ultrasound guided (picture in chart) Adductor canal block Narrative:  Injection made incrementally with aspirations every 20 mL.  Performed by: Personally  Anesthesiologist: Bonita QuinGUIDETTI, Kathy Stabler S  Additional Notes: Patient tolerated procedure well.

## 2016-08-24 NOTE — H&P (Signed)
PREOPERATIVE H&P  Chief Complaint: djd left knee  HPI: Kathy Young is a 70 y.o. female who presents for preoperative history and physical with a diagnosis of djd left knee. Symptoms are rated as moderate to severe, and have been worsening.  This is significantly impairing activities of daily living.  She has elected for surgical management.   She has failed injections, activity modification, anti-inflammatories, and assistive devices.  Preoperative X-rays demonstrate end stage degenerative changes with osteophyte formation, loss of joint space, subchondral sclerosis.  She has had the other side done and is very happy with the results.   Past Medical History:  Diagnosis Date  . Anemia   . Anxiety   . Arthritis   . Complication of anesthesia    Hypoventalation and de sats after shoulder surgery- has sleep apnea, but can not use CPAP due to Trigeminal Neuralgia  . Depression   . Diabetes mellitus without complication (HCC)    type II  . Hypercholesteremia   . Hypertension   . Hypothyroidism   . Lymphedema    legs, bilateral - wears support hose  . Noncompliance with CPAP treatment   . Obesity   . OSA (obstructive sleep apnea)   . Pneumonia    infant  . Primary localized osteoarthritis of right knee 02/24/2016  . Reflux   . Seizures (HCC)    not for 20 years. does not see a neurologist 08/12/16- not for 30 years  . Severe obesity (BMI >= 40) (HCC) 02/24/2016  . SOB (shortness of breath)   . Trigeminal neuralgia   . Trigeminal neuralgia pain   . Urinary incontinence   . Vitamin D deficiency    Past Surgical History:  Procedure Laterality Date  . APPENDECTOMY    . CARPAL TUNNEL RELEASE    . CHOLECYSTECTOMY    . COLONOSCOPY    . OVARIAN CYST REMOVAL    . SHOULDER SURGERY    . TONSILLECTOMY    . TOTAL KNEE ARTHROPLASTY Right 02/24/2016   Procedure: TOTAL KNEE ARTHROPLASTY;  Surgeon: Teryl LucyJoshua Chianna Spirito, MD;  Location: [facility] OR;  Service: Orthopedics;  Laterality: Right;  . TUBAL  LIGATION     Social History   Social History  . Marital status: Divorced    Spouse name: N/A  . Number of children: N/A  . Years of education: N/A   Social History Main Topics  . Smoking status: Never Smoker  . Smokeless tobacco: Never Used  . Alcohol use No  . Drug use: No  . Sexual activity: Not Asked   Other Topics Concern  . None   Social History Narrative  . None   Family History  Problem Relation Age of Onset  . Cancer Mother   . Hypertension Mother   . Diabetes Father   . Heart disease Father   . Heart attack Father   . Diabetes Sister   . Diabetes Brother    Allergies  Allergen Reactions  . Dilantin [Phenytoin Sodium Extended] Hives and Itching  . Tegretol [Carbamazepine] Hives and Itching  . Adhesive [Tape] Rash  . Latex Itching and Rash   Prior to Admission medications   Medication Sig Start Date End Date Taking? Authorizing Provider  atorvastatin (LIPITOR) 10 MG tablet Take 10 mg by mouth daily at 6 PM. For hypercholesteremia   Yes Historical Provider, MD  baclofen (LIORESAL) 10 MG tablet Take 1 tablet (10 mg total) by mouth 3 (three) times daily. As needed for muscle spasm 02/24/16  Yes Teryl LucyJoshua Arma Reining,  MD  cholecalciferol (VITAMIN D) 1000 UNITS tablet Take 1,000 Units by mouth daily.   Yes Historical Provider, MD  diclofenac sodium (VOLTAREN) 1 % GEL Apply 2 g topically as needed (for pain).    Yes Historical Provider, MD  escitalopram (LEXAPRO) 20 MG tablet Take 20 mg by mouth at bedtime. For depression   Yes Historical Provider, MD  gabapentin (NEURONTIN) 400 MG capsule Take 400 mg by mouth 2 (two) times daily.   Yes Historical Provider, MD  levothyroxine (SYNTHROID, LEVOTHROID) 200 MCG tablet Take 200 mcg by mouth daily before breakfast. **brand name only* For hypothyroidism   Yes Historical Provider, MD  lisinopril (PRINIVIL,ZESTRIL) 20 MG tablet Take 20 mg by mouth daily. For HTN   Yes Historical Provider, MD  metFORMIN (GLUCOPHAGE-XR) 500 MG 24 hr  tablet Take 500 mg by mouth 2 (two) times daily with a meal. For diabetes 01/16/16  Yes Historical Provider, MD  Multiple Vitamin (MULTIVITAMIN) tablet Take 1 tablet by mouth daily.   Yes Historical Provider, MD  oxybutynin (DITROPAN XL) 15 MG 24 hr tablet Take 15 mg by mouth every morning. For urinary incontinence   Yes Historical Provider, MD  traMADol (ULTRAM) 50 MG tablet Take 50 mg by mouth every 6 (six) hours as needed (for pain).    Yes Historical Provider, MD  valACYclovir (VALTREX) 500 MG tablet Take 500 mg by mouth daily. 07/28/15  Yes Historical Provider, MD  ondansetron (ZOFRAN) 4 MG tablet Take 1 tablet (4 mg total) by mouth every 8 (eight) hours as needed for nausea or vomiting. 02/24/16   Teryl Lucy, MD     Positive ROS: All other systems have been reviewed and were otherwise negative with the exception of those mentioned in the HPI and as above.  Physical Exam:  Estimated body mass index is 41.62 kg/m as calculated from the following:   Height as of this encounter: 5\' 9"  (1.753 m).   Weight as of this encounter: 127.8 kg (281 lb 13 oz).  General: Alert, no acute distress Cardiovascular: No pedal edema Respiratory: No cyanosis, no use of accessory musculature GI: No organomegaly, abdomen is soft and non-tender Skin: No lesions in the area of chief complaint Neurologic: Sensation intact distally Psychiatric: Patient is competent for consent with normal mood and affect Lymphatic: No axillary or cervical lymphadenopathy  MUSCULOSKELETAL: left knee with varus, crepitance, painful arc 10-90, moderate effusion  Assessment: djd left knee, primary localized   Plan: Plan for Procedure(s): TOTAL KNEE ARTHROPLASTY  The risks benefits and alternatives were discussed with the patient including but not limited to the risks of nonoperative treatment, versus surgical intervention including infection, bleeding, nerve injury,  blood clots, cardiopulmonary complications, morbidity,  mortality, among others, and they were willing to proceed.   Eulas Post, MD Cell [phone]   08/24/2016 7:19 AM

## 2016-08-24 NOTE — Op Note (Signed)
DATE OF SURGERY:  08/24/2016 TIME: 9:51 AM  PATIENT NAME:  Kathy Young   AGE: 70 y.o.    PRE-OPERATIVE DIAGNOSIS:  left knee primary localized osteoarthritis  POST-OPERATIVE DIAGNOSIS:  Same  PROCEDURE:  Procedure(s):  LEFT TOTAL KNEE ARTHROPLASTY   SURGEON:  Eulas PostLANDAU,Liseth Wann P, MD   ASSISTANT:  Janace LittenBrandon Parry, OPA-C, present and scrubbed throughout the case, critical for assistance with exposure, retraction, instrumentation, and closure.   OPERATIVE IMPLANTS: Depuy fixed bearing Attune Posterior Stabilized.  Femur size 5, Tibia size 4, Patella size 32 3-peg oval button, with a 5 polyethylene insert.  Estimated blood loss: 150 mL  PREOPERATIVE INDICATIONS:  Janene HarveyCandace E Margo AyeHall is a 70 y.o. year old female with end stage bone on bone degenerative arthritis of the knee who failed conservative treatment, including injections, antiinflammatories, activity modification, and assistive devices, and had significant impairment of their activities of daily living, and elected for Total Knee Arthroplasty.   The risks, benefits, and alternatives were discussed at length including but not limited to the risks of infection, bleeding, nerve injury, stiffness, blood clots, the need for revision surgery, cardiopulmonary complications, among others, and they were willing to proceed.  OPERATIVE FINDINGS AND UNIQUE ASPECTS OF THE CASE:  The sizes were much smaller than her contralateral side. I had to cut the tibia twice, and there was a fair amount of erosion on the posterior medial aspect of the tibial condyle, such that the first cut barely made it across the cortex. There was a fair amount of hypertrophic osteophyte formation as well. The patella measured 19 mm before the cut, and 14 mm after.  OPERATIVE DESCRIPTION:  The patient was brought to the operative room and placed in a supine position.  Spinal anesthesia was administered.  IV antibiotics were given.  The lower extremity was prepped and draped in  the usual sterile fashion.  Time out was performed.  The leg was elevated and exsanguinated and the tourniquet was inflated.  Anterior quadriceps tendon splitting approach was performed.  The patella was everted and osteophytes were removed.  The anterior horn of the medial and lateral meniscus was removed.   The distal femur was opened with the drill and the intramedullary distal femoral cutting jig was utilized, set at 5 degrees resecting 10 mm off the distal femur.  Care was taken to protect the collateral ligaments.  Then the extramedullary tibial cutting jig was utilized making the appropriate cut using the anterior tibial crest as a reference building in appropriate posterior slope.  Care was taken during the cut to protect the medial and collateral ligaments.  The proximal tibia was removed along with the posterior horns of the menisci.  The PCL was sacrificed.    The extensor gap was measured and was approximately 10mm, although I did have to go back and cut the tibia a second time in order to achieve this.    The distal femoral sizing jig was applied, taking care to avoid notching.  Then the 4-in-1 cutting jig was applied and the anterior and posterior femur was cut, along with the chamfer cuts.  All posterior osteophytes were removed.  The flexion gap was then measured and was symmetric with the extension gap.  I completed the distal femoral preparation using the appropriate jig to prepare the box.  The patella was then measured, and cut with the saw.  The thickness before the cut was 19 and after the cut was 14.  The proximal tibia sized and prepared accordingly  with the reamer and the punch, and then all components were trialed with the 10mm poly insert.  The knee was found to have excellent balance and full motion.    The above named components were then cemented into place and all excess cement was removed.  The real polyethylene implant was placed.  After the cement had cured I  released the tourniquet and confirmed excellent hemostasis with no major posterior vessel injury.    The knee was easily taken through a range of motion and the patella tracked well and the knee irrigated copiously and the parapatellar and subcutaneous tissue closed with vicryl, and monocryl with steri strips for the skin.  The wounds were injected with marcaine, and dressed with sterile gauze and the patient was awakened and returned to the PACU in stable and satisfactory condition.  There were no complications.  Total tourniquet time was ~90 minutes.

## 2016-08-24 NOTE — Evaluation (Signed)
Physical Therapy Evaluation Patient Details Name: Kathy Young MRN: [id number] DOB: 09-Apr-1946 Today's Date: 08/24/2016   History of Present Illness  70 y.o. female admitted for L TKA. PMH of R TKA March 2017, obesity, anxiety/depression, trigeminal neuralgia, lymphedema BLEs.   Clinical Impression  Pt is s/p TKA resulting in the deficits listed below (see PT Problem List). Pt ambulated 8' with RW and min/guard assist. Good progress expected.  Pt will benefit from skilled PT to increase their independence and safety with mobility to allow discharge to the venue listed below.      Follow Up Recommendations SNF    Equipment Recommendations  None recommended by PT    Recommendations for Other Services OT consult     Precautions / Restrictions Precautions Precautions: Knee Required Braces or Orthoses:  (pt able to do SLR LLE, no KI needed for eval) Restrictions Weight Bearing Restrictions: No Other Position/Activity Restrictions: WBAT LLE      Mobility  Bed Mobility Overal bed mobility: Modified Independent             General bed mobility comments: with rail  Transfers Overall transfer level: Needs assistance Equipment used: Rolling walker (2 wheeled) Transfers: Sit to/from Stand Sit to Stand: Min assist         General transfer comment: VCs hand placement, min A to power up  Ambulation/Gait Ambulation/Gait assistance: Min guard Ambulation Distance (Feet): 8 Feet Assistive device: Rolling walker (2 wheeled) Gait Pattern/deviations: Step-to pattern;Decreased step length - left;Decreased step length - right;Decreased stance time - left;Antalgic   Gait velocity interpretation: Below normal speed for age/gender General Gait Details: steady with RW, no LOB  Stairs            Wheelchair Mobility    Modified Rankin (Stroke Patients Only)       Balance Overall balance assessment: Modified Independent                                            Pertinent Vitals/Pain Pain Assessment: 0-10 Pain Score: 7  Pain Location: L knee with activity, 5/10 at rest Pain Descriptors / Indicators: Sore Pain Intervention(s): Monitored during session;Premedicated before session;Ice applied;Limited activity within patient's tolerance    Home Living Family/patient expects to be discharged to:: Skilled nursing facility Living Arrangements: Alone               Additional Comments: going to SNF because lives alone.    Prior Function Level of Independence: Independent with assistive device(s)         Comments: independent with SPC     Hand Dominance   Dominant Hand: Right    Extremity/Trunk Assessment   Upper Extremity Assessment: Overall WFL for tasks assessed           Lower Extremity Assessment: LLE deficits/detail   LLE Deficits / Details: Knee AAROM 0-40*, ankle WNL, SLR 3/5, sensation intact to light touch, good quad set  Cervical / Trunk Assessment: Normal  Communication   Communication: No difficulties  Cognition Arousal/Alertness: Awake/alert Behavior During Therapy: WFL for tasks assessed/performed Overall Cognitive Status: Within Functional Limits for tasks assessed                      General Comments      Exercises Total Joint Exercises Ankle Circles/Pumps: AROM;Both;10 reps Quad Sets: AROM;Left;5 reps;Supine Heel Slides: AAROM;Left;10 reps;Supine Long Arc  Quad: AROM;Left;5 reps;Seated Goniometric ROM: 0-40* AAROM      Assessment/Plan    PT Assessment Patient needs continued PT services  PT Diagnosis Difficulty walking;Acute pain   PT Problem List Decreased strength;Decreased range of motion;Decreased activity tolerance;Decreased mobility;Pain;Obesity  PT Treatment Interventions DME instruction;Gait training;Functional mobility training;Therapeutic exercise;Therapeutic activities;Patient/family education   PT Goals (Current goals can be found in the Care Plan section) Acute  Rehab PT Goals Patient Stated Goal: to be able to swim, walk without a cane, sleep without pain PT Goal Formulation: With patient/family Time For Goal Achievement: 09/07/16 Potential to Achieve Goals: Good    Frequency 7X/week   Barriers to discharge        Co-evaluation               End of Session Equipment Utilized During Treatment: Gait belt Activity Tolerance: Patient tolerated treatment well;Patient limited by pain Patient left: in chair;with call bell/phone within reach;with family/visitor present;with nursing/sitter in room Nurse Communication: Mobility status         Time: [id number] PT Time Calculation (min) (ACUTE ONLY): 23 min   Charges:   PT Evaluation $PT Eval Low Complexity: 1 Procedure PT Treatments $Gait Training: 8-22 mins   PT G Codes:        Ralene BatheUhlenberg, Avyaan Summer Kistler 08/24/2016, 1:50 PM [phone]

## 2016-08-24 NOTE — Transfer of Care (Signed)
Immediate Anesthesia Transfer of Care Note  Patient: Kathy Young  Procedure(s) Performed: Procedure(s): TOTAL KNEE ARTHROPLASTY (Left)  Patient Location: PACU  Anesthesia Type:Spinal  Level of Consciousness:  sedated, patient cooperative and responds to stimulation  Airway & Oxygen Therapy:Patient Spontanous Breathing and Patient connected to face mask oxgen  Post-op Assessment:  Report given to PACU RN and Post -op Vital signs reviewed and stable  Post vital signs:  Reviewed and stable  Last Vitals:  Vitals:   08/24/16 0624  BP: (!) 106/57  Pulse: 73  Resp: 20  Temp: 37.2 C    Complications: No apparent anesthesia complications

## 2016-08-24 NOTE — Discharge Instructions (Signed)
INSTRUCTIONS AFTER JOINT REPLACEMENT  ° °o Remove items at home which could result in a fall. This includes throw rugs or furniture in walking pathways °o ICE to the affected joint every three hours while awake for 30 minutes at a time, for at least the first 3-5 days, and then as needed for pain and swelling.  Continue to use ice for pain and swelling. You may notice swelling that will progress down to the foot and ankle.  This is normal after surgery.  Elevate your leg when you are not up walking on it.   °o Continue to use the breathing machine you got in the hospital (incentive spirometer) which will help keep your temperature down.  It is common for your temperature to cycle up and down following surgery, especially at night when you are not up moving around and exerting yourself.  The breathing machine keeps your lungs expanded and your temperature down. ° ° °DIET:  As you were doing prior to hospitalization, we recommend a well-balanced diet. ° °DRESSING / WOUND CARE / SHOWERING ° °You may change your dressing 3-5 days after surgery.  Then change the dressing every day with sterile gauze.  Please use good hand washing techniques before changing the dressing.  Do not use any lotions or creams on the incision until instructed by your surgeon. ° °ACTIVITY ° °o Increase activity slowly as tolerated, but follow the weight bearing instructions below.   °o No driving for 6 weeks or until further direction given by your physician.  You cannot drive while taking narcotics.  °o No lifting or carrying greater than 10 lbs. until further directed by your surgeon. °o Avoid periods of inactivity such as sitting longer than an hour when not asleep. This helps prevent blood clots.  °o You may return to work once you are authorized by your doctor.  ° ° ° °WEIGHT BEARING  ° °Weight bearing as tolerated with assist device (walker, cane, etc) as directed, use it as long as suggested by your surgeon or therapist, typically at  least 4-6 weeks. ° ° °EXERCISES ° °Results after joint replacement surgery are often greatly improved when you follow the exercise, range of motion and muscle strengthening exercises prescribed by your doctor. Safety measures are also important to protect the joint from further injury. Any time any of these exercises cause you to have increased pain or swelling, decrease what you are doing until you are comfortable again and then slowly increase them. If you have problems or questions, call your caregiver or physical therapist for advice.  ° °Rehabilitation is important following a joint replacement. After just a few days of immobilization, the muscles of the leg can become weakened and shrink (atrophy).  These exercises are designed to build up the tone and strength of the thigh and leg muscles and to improve motion. Often times heat used for twenty to thirty minutes before working out will loosen up your tissues and help with improving the range of motion but do not use heat for the first two weeks following surgery (sometimes heat can increase post-operative swelling).  ° °These exercises can be done on a training (exercise) mat, on the floor, on a table or on a bed. Use whatever works the best and is most comfortable for you.    Use music or television while you are exercising so that the exercises are a pleasant break in your day. This will make your life better with the exercises acting as a break   in your routine that you can look forward to.   Perform all exercises about fifteen times, three times per day or as directed.  You should exercise both the operative leg and the other leg as well. ° °Exercises include: °  °• Quad Sets - Tighten up the muscle on the front of the thigh (Quad) and hold for 5-10 seconds.   °• Straight Leg Raises - With your knee straight (if you were given a brace, keep it on), lift the leg to 60 degrees, hold for 3 seconds, and slowly lower the leg.  Perform this exercise against  resistance later as your leg gets stronger.  °• Leg Slides: Lying on your back, slowly slide your foot toward your buttocks, bending your knee up off the floor (only go as far as is comfortable). Then slowly slide your foot back down until your leg is flat on the floor again.  °• Angel Wings: Lying on your back spread your legs to the side as far apart as you can without causing discomfort.  °• Hamstring Strength:  Lying on your back, push your heel against the floor with your leg straight by tightening up the muscles of your buttocks.  Repeat, but this time bend your knee to a comfortable angle, and push your heel against the floor.  You may put a pillow under the heel to make it more comfortable if necessary.  ° °A rehabilitation program following joint replacement surgery can speed recovery and prevent re-injury in the future due to weakened muscles. Contact your doctor or a physical therapist for more information on knee rehabilitation.  ° ° °CONSTIPATION ° °Constipation is defined medically as fewer than three stools per week and severe constipation as less than one stool per week.  Even if you have a regular bowel pattern at home, your normal regimen is likely to be disrupted due to multiple reasons following surgery.  Combination of anesthesia, postoperative narcotics, change in appetite and fluid intake all can affect your bowels.  ° °YOU MUST use at least one of the following options; they are listed in order of increasing strength to get the job done.  They are all available over the counter, and you may need to use some, POSSIBLY even all of these options:   ° °Drink plenty of fluids (prune juice may be helpful) and high fiber foods °Colace 100 mg by mouth twice a day  °Senokot for constipation as directed and as needed Dulcolax (bisacodyl), take with full glass of water  °Miralax (polyethylene glycol) once or twice a day as needed. ° °If you have tried all these things and are unable to have a bowel  movement in the first 3-4 days after surgery call either your surgeon or your primary doctor.   ° °If you experience loose stools or diarrhea, hold the medications until you stool forms back up.  If your symptoms do not get better within 1 week or if they get worse, check with your doctor.  If you experience "the worst abdominal pain ever" or develop nausea or vomiting, please contact the office immediately for further recommendations for treatment. ° ° °ITCHING:  If you experience itching with your medications, try taking only a single pain pill, or even half a pain pill at a time.  You can also use Benadryl over the counter for itching or also to help with sleep.  ° °TED HOSE STOCKINGS:  Use stockings on both legs until for at least 2 weeks or as   directed by physician office. They may be removed at night for sleeping. ° °MEDICATIONS:  See your medication summary on the “After Visit Summary” that nursing will review with you.  You may have some home medications which will be placed on hold until you complete the course of blood thinner medication.  It is important for you to complete the blood thinner medication as prescribed. ° °PRECAUTIONS:  If you experience chest pain or shortness of breath - call 911 immediately for transfer to the hospital emergency department.  ° °If you develop a fever greater that 101 F, purulent drainage from wound, increased redness or drainage from wound, foul odor from the wound/dressing, or calf pain - CONTACT YOUR SURGEON.   °                                                °FOLLOW-UP APPOINTMENTS:  If you do not already have a post-op appointment, please call the office for an appointment to be seen by your surgeon.  Guidelines for how soon to be seen are listed in your “After Visit Summary”, but are typically between 1-4 weeks after surgery. ° °OTHER INSTRUCTIONS:  ° °Knee Replacement:  Do not place pillow under knee, focus on keeping the knee straight while resting. CPM  instructions: 0-90 degrees, 2 hours in the morning, 2 hours in the afternoon, and 2 hours in the evening. Place foam block, curve side up under heel at all times except when in CPM or when walking.  DO NOT modify, tear, cut, or change the foam block in any way. ° °MAKE SURE YOU:  °• Understand these instructions.  °• Get help right away if you are not doing well or get worse.  ° ° °Thank you for letting us be a part of your medical care team.  It is a privilege we respect greatly.  We hope these instructions will help you stay on track for a fast and full recovery!  ° °Information on my medicine - XARELTO® (Rivaroxaban) ° °This medication education was reviewed with me or my healthcare representative as part of my discharge preparation.  The pharmacist that spoke with me during my hospital stay was:  Liddy Deam Brown, RPH ° °Why was Xarelto® prescribed for you? °Xarelto® was prescribed for you to reduce the risk of blood clots forming after orthopedic surgery. The medical term for these abnormal blood clots is venous thromboembolism (VTE). ° °What do you need to know about xarelto® ? °Take your Xarelto® ONCE DAILY at the same time every day. °You may take it either with or without food. ° °If you have difficulty swallowing the tablet whole, you may crush it and mix in applesauce just prior to taking your dose. ° °Take Xarelto® exactly as prescribed by your doctor and DO NOT stop taking Xarelto® without talking to the doctor who prescribed the medication.  Stopping without other VTE prevention medication to take the place of Xarelto® may increase your risk of developing a clot. ° °After discharge, you should have regular check-up appointments with your healthcare provider that is prescribing your Xarelto®.   ° °What do you do if you miss a dose? °If you miss a dose, take it as soon as you remember on the same day then continue your regularly scheduled once daily regimen the next day. Do not take two doses of    Xarelto® on the same day.  ° °Important Safety Information °A possible side effect of Xarelto® is bleeding. You should call your healthcare provider right away if you experience any of the following: °? Bleeding from an injury or your nose that does not stop. °? Unusual colored urine (red or dark brown) or unusual colored stools (red or black). °? Unusual bruising for unknown reasons. °? A serious fall or if you hit your head (even if there is no bleeding). ° °Some medicines may interact with Xarelto® and might increase your risk of bleeding while on Xarelto®. To help avoid this, consult your healthcare provider or pharmacist prior to using any new prescription or non-prescription medications, including herbals, vitamins, non-steroidal anti-inflammatory drugs (NSAIDs) and supplements. ° °This website has more information on Xarelto®: www.xarelto.com. ° ° ° °

## 2016-08-24 NOTE — Anesthesia Procedure Notes (Signed)
Spinal  Patient location during procedure: OR Start time: 08/24/2016 7:32 AM End time: 08/24/2016 7:42 AM Staffing Anesthesiologist: Bonita QuinGUIDETTI, Lakeisa Heninger S Performed: anesthesiologist  Preanesthetic Checklist Completed: patient identified, site marked, surgical consent, pre-op evaluation, timeout performed, IV checked, risks and benefits discussed and monitors and equipment checked Spinal Block Patient position: sitting Prep: ChloraPrep Patient monitoring: heart rate, cardiac monitor, continuous pulse ox and blood pressure Approach: midline Location: L4-5 Injection technique: single-shot Needle Needle type: Quincke  Needle gauge: 22 G

## 2016-08-25 ENCOUNTER — Encounter (HOSPITAL_COMMUNITY): Payer: Self-pay | Admitting: Orthopedic Surgery

## 2016-08-25 LAB — CBC
HCT: 31.3 % — ABNORMAL LOW (ref 36.0–46.0)
HEMOGLOBIN: 10.4 g/dL — AB (ref 12.0–15.0)
MCH: 32.3 pg (ref 26.0–34.0)
MCHC: 33.2 g/dL (ref 30.0–36.0)
MCV: 97.2 fL (ref 78.0–100.0)
PLATELETS: 135 10*3/uL — AB (ref 150–400)
RBC: 3.22 MIL/uL — ABNORMAL LOW (ref 3.87–5.11)
RDW: 13.4 % (ref 11.5–15.5)
WBC: 7.6 10*3/uL (ref 4.0–10.5)

## 2016-08-25 LAB — BASIC METABOLIC PANEL
Anion gap: 8 (ref 5–15)
BUN: 17 mg/dL (ref 6–20)
CHLORIDE: 99 mmol/L — AB (ref 101–111)
CO2: 25 mmol/L (ref 22–32)
CREATININE: 0.93 mg/dL (ref 0.44–1.00)
Calcium: 9 mg/dL (ref 8.9–10.3)
GFR calc Af Amer: >60 mL/min (ref >60)
GFR calc non Af Amer: >60 mL/min (ref >60)
Glucose, Bld: 155 mg/dL — ABNORMAL HIGH (ref 65–99)
Potassium: 4.5 mmol/L (ref 3.5–5.1)
SODIUM: 132 mmol/L — AB (ref 135–145)

## 2016-08-25 LAB — GLUCOSE, CAPILLARY
GLUCOSE-CAPILLARY: 154 mg/dL — AB (ref 65–99)
Glucose-Capillary: 169 mg/dL — ABNORMAL HIGH (ref 65–99)
Glucose-Capillary: 167 mg/dL — ABNORMAL HIGH (ref 65–99)
Glucose-Capillary: 131 mg/dL — ABNORMAL HIGH (ref 65–99)

## 2016-08-25 NOTE — Progress Notes (Signed)
Patient ID: Kathy Young, female   DOB: 03/02/1946, 70 y.o.   MRN: [id number]     Subjective:  Patient reports pain as mild to moderate.  Patient denies any CP or SOB.    Objective:   VITALS:   Vitals:   08/24/16 2150 08/25/16 0206 08/25/16 0210 08/25/16 0553  BP: 128/61 (!) 108/52  (!) 101/46  Pulse: 90 96  89  Resp: 18 18    Temp: 98.6 F (37 C) 100.1 F (37.8 C) 99.7 F (37.6 C) 99.4 F (37.4 C)  TempSrc: Oral Oral Oral Oral  SpO2: 92% 94%  94%  Weight:      Height:        ABD soft Sensation intact distally Dorsiflexion/Plantar flexion intact Incision: dressing C/D/I and no drainage Good foot and ankle motion  Lab Results  Component Value Date   WBC 7.6 08/25/2016   HGB 10.4 (L) 08/25/2016   HCT 31.3 (L) 08/25/2016   MCV 97.2 08/25/2016   PLT 135 (L) 08/25/2016   BMET    Component Value Date/Time   NA 132 (L) 08/25/2016 0607   NA 136 (A) 03/03/2016   K 4.5 08/25/2016 0607   CL 99 (L) 08/25/2016 0607   CO2 25 08/25/2016 0607   GLUCOSE 155 (H) 08/25/2016 0607   BUN 17 08/25/2016 0607   BUN 28 (A) 03/03/2016   CREATININE 0.93 08/25/2016 0607   CALCIUM 9.0 08/25/2016 0607   GFRNONAA >60 08/25/2016 0607   GFRAA >60 08/25/2016 0607     Assessment/Plan: 1 Day Post-Op   Principal Problem:   Primary localized osteoarthritis of left knee Active Problems:   Primary localized osteoarthritis of right knee   S/P total knee arthroplasty   Advance diet Up with therapy WBAT Dry dressing PRN Plan dressing change tomorrow Plan for SNF placement   Haskel KhanDOUGLAS PARRY, BRANDON 08/25/2016, 8:23 AM  Seen and agree with above.   Teryl LucyJoshua Rabia Argote, MD Cell [phone]

## 2016-08-25 NOTE — Progress Notes (Signed)
Physical Therapy Treatment Patient Details Name: Kathy PaisCandace E Wisecup MRN: [id number] DOB: 11/26/1946 Today's Date: 08/25/2016    History of Present Illness 70 y.o. female admitted for L TKA. PMH of R TKA March 2017, obesity, anxiety/depression, trigeminal neuralgia, lymphedema BLEs.     PT Comments    Pt progressing well with mobility, ambulated 20' and performed TKA exercises with min A. Rewrapped LLE as ace wrap was too tight and pt had edema inferior to ace wrap, noted h/o lymphedema.   Follow Up Recommendations  SNF     Equipment Recommendations  None recommended by PT    Recommendations for Other Services OT consult     Precautions / Restrictions Precautions Precautions: Knee Restrictions LLE Weight Bearing: Weight bearing as tolerated Other Position/Activity Restrictions: WBAT LLE    Mobility  Bed Mobility Overal bed mobility: Needs Assistance Bed Mobility: Sit to Supine       Sit to supine: Min assist   General bed mobility comments: min A for LLE, instructed pt to self assist LLE with RLE  Transfers Overall transfer level: Needs assistance Equipment used: Rolling walker (2 wheeled) Transfers: Sit to/from Stand Sit to Stand: Min assist         General transfer comment: VCs hand placement, min A to power up and steady  Ambulation/Gait Ambulation/Gait assistance: Min guard Ambulation Distance (Feet): 20 Feet Assistive device: Rolling walker (2 wheeled) Gait Pattern/deviations: Step-to pattern;Decreased step length - left;Decreased step length - right;Decreased weight shift to left   Gait velocity interpretation: Below normal speed for age/gender General Gait Details: steady with RW, no LOB, VCs for sequencing, applied 2L O2 [place]   Stairs            Wheelchair Mobility    Modified Rankin (Stroke Patients Only)       Balance Overall balance assessment: Modified Independent                                  Cognition  Arousal/Alertness: Awake/alert Behavior During Therapy: WFL for tasks assessed/performed Overall Cognitive Status: Within Functional Limits for tasks assessed                      Exercises Total Joint Exercises Ankle Circles/Pumps: AROM;Both;15 reps;Supine Quad Sets: AROM;Supine;10 reps;Both Short Arc Quad: AAROM;Left;10 reps;Supine Heel Slides: AAROM;Left;10 reps;Supine Straight Leg Raises: AAROM;Left;10 reps;Supine Long Arc Quad: AROM;Left;10 reps;Seated Knee Flexion: AROM;AAROM;Left;10 reps;Seated Goniometric ROM: 0-50* AAROM L knee    General Comments        Pertinent Vitals/Pain Pain Score: 3  Pain Location: L knee with activity Pain Descriptors / Indicators: Sore Pain Intervention(s): Limited activity within patient's tolerance;Monitored during session;Premedicated before session;Ice applied    Home Living                      Prior Function            PT Goals (current goals can now be found in the care plan section) Acute Rehab PT Goals Patient Stated Goal: to be able to swim, walk without a cane, sleep without pain PT Goal Formulation: With patient Time For Goal Achievement: 09/07/16 Potential to Achieve Goals: Good Progress towards PT goals: Progressing toward goals    Frequency  7X/week    PT Plan Current plan remains appropriate    Co-evaluation             End of Session  Equipment Utilized During Treatment: Gait belt Activity Tolerance: Patient tolerated treatment well Patient left: with call bell/phone within reach;in bed     Time: 1610-9604 PT Time Calculation (min) (ACUTE ONLY): 30 min  Charges:  $Gait Training: 8-22 mins $Therapeutic Exercise: 8-22 mins                    G Codes:      Tamala Ser 08/25/2016, 1:47 PM [phone]

## 2016-08-25 NOTE — Progress Notes (Signed)
Physical Therapy Treatment Patient Details Name: Kathy Young MRN: [id number] DOB: [birth year] Today's Date: 08/25/2016    History of Present Illness 70 y.o. female admitted for L TKA. PMH of R TKA March 2017, obesity, anxiety/depression, trigeminal neuralgia, lymphedema BLEs.     PT Comments    Pt progressing well with mobility, she ambulated 30' with RW and performed L TKA exercises with min A. She is independent with L SLR.   Follow Up Recommendations  SNF     Equipment Recommendations  None recommended by PT    Recommendations for Other Services OT consult     Precautions / Restrictions Precautions Precautions: Knee Restrictions LLE Weight Bearing: Weight bearing as tolerated Other Position/Activity Restrictions: WBAT LLE    Mobility  Bed Mobility Overal bed mobility: Modified Independent             General bed mobility comments: with rail  Transfers Overall transfer level: Needs assistance Equipment used: Rolling walker (2 wheeled) Transfers: Sit to/from Stand Sit to Stand: Min assist         General transfer comment: VCs hand placement, min A to power up and steady  Ambulation/Gait Ambulation/Gait assistance: Min guard Ambulation Distance (Feet): 30 Feet Assistive device: Rolling walker (2 wheeled) Gait Pattern/deviations: Step-to pattern;Decreased step length - right;Decreased step length - left;Trunk flexed     General Gait Details: steady with RW, no LOB, VCs for sequencing, SaO2 88% on RA after walking, applied 2L O2 [place], came up to 92%   Stairs            Wheelchair Mobility    Modified Rankin (Stroke Patients Only)       Balance Overall balance assessment: Modified Independent                                  Cognition Arousal/Alertness: Awake/alert Behavior During Therapy: WFL for tasks assessed/performed Overall Cognitive Status: Within Functional Limits for tasks assessed                       Exercises Total Joint Exercises Ankle Circles/Pumps: AROM;Both;10 reps Quad Sets: AROM;Supine;10 reps;Both Short Arc Quad: AAROM;Left;10 reps;Supine Heel Slides: AAROM;Left;10 reps;Supine Straight Leg Raises: AAROM;Left;10 reps;Supine Goniometric ROM: 0-45* AAROM L knee    General Comments        Pertinent Vitals/Pain Pain Score: 3  Pain Location: L knee with activity Pain Descriptors / Indicators: Sore Pain Intervention(s): Limited activity within patient's tolerance;Monitored during session;Premedicated before session;Ice applied    Home Living                      Prior Function            PT Goals (current goals can now be found in the care plan section) Acute Rehab PT Goals Patient Stated Goal: to be able to swim, walk without a cane, sleep without pain PT Goal Formulation: With patient Time For Goal Achievement: 09/07/16 Potential to Achieve Goals: Good Progress towards PT goals: Progressing toward goals    Frequency  7X/week    PT Plan Current plan remains appropriate    Co-evaluation             End of Session Equipment Utilized During Treatment: Gait belt Activity Tolerance: Patient tolerated treatment well Patient left: in chair;with call bell/phone within reach     Time: 0950-1021 PT Time Calculation (min) (ACUTE ONLY): 31  min  Charges:  $Gait Training: 8-22 mins $Therapeutic Exercise: 8-22 mins                    G Codes:      Tamala SerUhlenberg, Leiah Giannotti Kistler 08/25/2016, 10:26 AM [phone]

## 2016-08-25 NOTE — [place] FL2 (Signed)
[place] MEDICAID FL2 LEVEL OF CARE SCREENING TOOL     IDENTIFICATION  Patient Name: Kathy Young Birthdate: 02/11/1946 Sex: female Admission Date (Current Location): 08/24/2016  [facility] [place]   Facility and Address:  [facility], [address], [place], [place] [address]      Provider Number: [id number]  Attending Physician Name and Address:  Teryl LucyJoshua Landau, MD  Relative Name and Phone Number:       Current Level of Care: SNF Recommended Level of Care: Skilled Nursing Facility Prior Approval Number:    Date Approved/Denied:   PASRR Number:    Discharge Plan: SNF    Current Diagnoses: Patient Active Problem List   Diagnosis Date Noted  . Primary localized osteoarthritis of left knee 08/24/2016  . S/P total knee arthroplasty 08/24/2016  . Peripheral nerve disease (HCC) 03/09/2016  . Controlled type 2 diabetes mellitus without complication (HCC) 03/09/2016  . Avitaminosis D 03/09/2016  . Adult hypothyroidism 03/09/2016  . HLD (hyperlipidemia) 03/09/2016  . Right knee pain 03/09/2016  . Primary localized osteoarthritis of right knee 02/24/2016  . Severe obesity (BMI >= 40) (HCC) 02/24/2016  . Essential (primary) hypertension 05/01/2015  . Body mass index (BMI) of 50-59.9 in adult (HCC) 05/01/2015  . Acquired hypothyroidism 05/01/2015  . Primary osteoarthritis of both knees 05/01/2015    Orientation RESPIRATION BLADDER Height & Weight     Self, Situation, Place, Time  Normal Incontinent Weight: 281 lb 13 oz (127.8 kg) Height:  5\' 9"  (175.3 cm)  BEHAVIORAL SYMPTOMS/MOOD NEUROLOGICAL BOWEL NUTRITION STATUS      Continent    AMBULATORY STATUS COMMUNICATION OF NEEDS Skin     Verbally Normal                       Personal Care Assistance Level of Assistance  Dressing, Bathing Bathing Assistance: Limited assistance   Dressing Assistance: Limited assistance     Functional Limitations Info              SPECIAL CARE FACTORS FREQUENCY                       Contractures      Additional Factors Info                  Current Medications (08/25/2016):  This is the current hospital active medication list Current Facility-Administered Medications  Medication Dose Route Frequency Provider Last Rate Last Dose  . 0.45 % NaCl with KCl 20 mEq / L infusion   Intravenous Continuous Teryl LucyJoshua Landau, MD 75 mL/hr at 08/24/16 1817    . acetaminophen (TYLENOL) tablet 650 mg  650 mg Oral Q6H PRN Teryl LucyJoshua Landau, MD   650 mg at 08/25/16 1244   Or  . acetaminophen (TYLENOL) suppository 650 mg  650 mg Rectal Q6H PRN Teryl LucyJoshua Landau, MD      . alum & mag hydroxide-simeth (MAALOX/MYLANTA) 200-200-20 MG/5ML suspension 30 mL  30 mL Oral Q4H PRN Teryl LucyJoshua Landau, MD      . atorvastatin (LIPITOR) tablet 10 mg  10 mg Oral q1800 Teryl LucyJoshua Landau, MD   10 mg at 08/25/16 1724  . baclofen (LIORESAL) tablet 10 mg  10 mg Oral TID PRN Teryl LucyJoshua Landau, MD      . bisacodyl (DULCOLAX) suppository 10 mg  10 mg Rectal Daily PRN Teryl LucyJoshua Landau, MD      . cholecalciferol (VITAMIN D) tablet 1,000 Units  1,000 Units Oral Daily Teryl Lucy, MD   1,000 Units at 08/25/16 [phone]  . diphenhydrAMINE (BENADRYL) 12.5 MG/5ML elixir 12.5-25 mg  12.5-25 mg Oral Q4H PRN Teryl Lucy, MD      . docusate sodium (COLACE) capsule 100 mg  100 mg Oral BID Teryl Lucy, MD   100 mg at 08/25/16 0858  . escitalopram (LEXAPRO) tablet 20 mg  20 mg Oral QHS Teryl Lucy, MD   20 mg at 08/24/16 2142  . gabapentin (NEURONTIN) capsule 400 mg  400 mg Oral BID Teryl Lucy, MD   400 mg at 08/25/16 0859  . HYDROmorphone (DILAUDID) injection 0.5 mg  0.5 mg Intravenous Q2H PRN Teryl Lucy, MD   0.5 mg at 08/25/16 0355  . insulin aspart (novoLOG) injection 0-15 Units  0-15 Units Subcutaneous TID WC Teryl Lucy, MD   3 Units at 08/25/16 1724  . levothyroxine (SYNTHROID, LEVOTHROID) tablet 200 mcg  200 mcg Oral QAC breakfast Teryl Lucy, MD   200 mcg at  08/25/16 0555  . lisinopril (PRINIVIL,ZESTRIL) tablet 20 mg  20 mg Oral Daily Teryl Lucy, MD   20 mg at 08/25/16 0859  . magnesium citrate solution 1 Bottle  1 Bottle Oral Once PRN Teryl Lucy, MD      . menthol-cetylpyridinium (CEPACOL) lozenge 3 mg  1 lozenge Oral PRN Teryl Lucy, MD       Or  . phenol (CHLORASEPTIC) mouth spray 1 spray  1 spray Mouth/Throat PRN Teryl Lucy, MD      . metFORMIN (GLUCOPHAGE-XR) 24 hr tablet 500 mg  500 mg Oral BID WC Teryl Lucy, MD   500 mg at 08/25/16 1724  . methocarbamol (ROBAXIN) tablet 500 mg  500 mg Oral Q6H PRN Teryl Lucy, MD   500 mg at 08/25/16 1724   Or  . methocarbamol (ROBAXIN) 500 mg in dextrose 5 % 50 mL IVPB  500 mg Intravenous Q6H PRN Teryl Lucy, MD      . metoCLOPramide (REGLAN) tablet 5-10 mg  5-10 mg Oral Q8H PRN Teryl Lucy, MD       Or  . metoCLOPramide (REGLAN) injection 5-10 mg  5-10 mg Intravenous Q8H PRN Teryl Lucy, MD      . multivitamin with minerals tablet 1 tablet  1 tablet Oral Daily Teryl Lucy, MD   1 tablet at 08/25/16 [phone]  . ondansetron (ZOFRAN) tablet 4 mg  4 mg Oral Q6H PRN Teryl Lucy, MD       Or  . ondansetron [facility]) injection 4 mg  4 mg Intravenous Q6H PRN Teryl Lucy, MD      . oxybutynin (DITROPAN XL) 24 hr tablet 15 mg  15 mg Oral q morning - 10a Teryl Lucy, MD   15 mg at 08/25/16 0859  . oxyCODONE (Oxy IR/ROXICODONE) immediate release tablet 5-10 mg  5-10 mg Oral Q3H PRN Teryl Lucy, MD   10 mg at 08/25/16 1724  . polyethylene glycol (MIRALAX / GLYCOLAX) packet 17 g  17 g Oral Daily PRN Teryl Lucy, MD      . rivaroxaban Carlena Hurl) tablet 10 mg  10 mg Oral Q breakfast Teryl Lucy, MD   10 mg at 08/25/16 0859  . senna (SENOKOT) tablet 8.6 mg  1 tablet Oral BID Teryl Lucy, MD   8.6 mg at 08/25/16 0858  . traMADol (ULTRAM) tablet 50 mg  50 mg Oral Q6H PRN Teryl Lucy, MD   50 mg at 08/24/16 2257  . valACYclovir (VALTREX) tablet 500 mg  500 mg  Oral Daily Teryl Lucy, MD    500 mg at 08/25/16 4098     Discharge Medications: Please see discharge summary for a list of discharge medications.  Relevant Imaging Results:  Relevant Lab Results:   Additional Information    Harlon Flor, [place] R

## 2016-08-25 NOTE — Evaluation (Signed)
Occupational Therapy Evaluation Patient Details Name: Kathy Young MRN: [id number] DOB: 12/10/1946 Today's Date: 08/25/2016    History of Present Illness 70 y.o. female admitted for L TKA. PMH of R TKA March 2017, obesity, anxiety/depression, trigeminal neuralgia, lymphedema BLEs.    Clinical Impression   Pt willing to work with therapy, and pleasant. Familiar and comfortable with AE from previous TKA. Pt able to perform ADL sitting. Pt presents with deficits listed below. Pt will benefit from skilled OT in the acute care setting to increase safety and independence in ADL before discharge to SNF.    Follow Up Recommendations  SNF;Supervision/Assistance - 24 hour    Equipment Recommendations  None recommended by OT    Recommendations for Other Services       Precautions / Restrictions Precautions Precautions: Knee Precaution Booklet Issued: No Restrictions Weight Bearing Restrictions: Yes LLE Weight Bearing: Weight bearing as tolerated      Mobility Bed Mobility Overal bed mobility: Needs Assistance Bed Mobility: Sit to Supine       Sit to supine: Min assist   General bed mobility comments: min A for LLE  Transfers Overall transfer level: Needs assistance Equipment used: Rolling walker (2 wheeled) Transfers: Sit to/from Stand Sit to Stand: Min assist         General transfer comment: VCs hand placement, min A to power up and steady    Balance                                            ADL Overall ADL's : Needs assistance/impaired Eating/Feeding: Set up;Sitting   Grooming: Wash/dry hands;Wash/dry face;Oral care;Set up;Sitting Grooming Details (indicate cue type and reason): sitting at sink     Lower Body Bathing: Set up;Sitting/lateral leans;With adaptive equipment Lower Body Bathing Details (indicate cue type and reason): Pt has and uses long handle sponge at baseline     Lower Body Dressing: Moderate assistance;With adaptive  equipment;Sit to/from stand Lower Body Dressing Details (indicate cue type and reason): Pt familiar with and uses AE for LB dressing Toilet Transfer: Minimal assistance;Ambulation;BSC;RW Toilet Transfer Details (indicate cue type and reason): min assist for safety on power up and descent Toileting- Clothing Manipulation and Hygiene: Moderate assistance;Sit to/from Nurse, children'sstand     Tub/Shower Transfer Details (indicate cue type and reason): Pt has a tub bench at home Functional mobility during ADLs: Minimal assistance;Rolling walker       Vision     Perception     Praxis      Pertinent Vitals/Pain Pain Assessment: 0-10 Pain Score: 3  Pain Location: L knee Pain Descriptors / Indicators: Grimacing;Guarding;Sore Pain Intervention(s): Limited activity within patient's tolerance;Monitored during session;Ice applied     Hand Dominance Right   Extremity/Trunk Assessment Upper Extremity Assessment Upper Extremity Assessment: Overall WFL for tasks assessed   Lower Extremity Assessment Lower Extremity Assessment: LLE deficits/detail LLE Deficits / Details: decreased ROM and strength post op       Communication Communication Communication: No difficulties   Cognition Arousal/Alertness: Awake/alert Behavior During Therapy: WFL for tasks assessed/performed Overall Cognitive Status: Within Functional Limits for tasks assessed                     General Comments       Exercises       Shoulder Instructions      Home Living Family/patient expects to  be discharged to:: Skilled nursing facility Living Arrangements: Alone                               Additional Comments: going to SNF because lives alone.      Prior Functioning/Environment Level of Independence: Independent with assistive device(s)        Comments: independent with SPC    OT Diagnosis: Acute pain   OT Problem List: Decreased strength;Decreased range of motion;Decreased activity  tolerance;Impaired balance (sitting and/or standing);Decreased knowledge of use of DME or AE;Pain   OT Treatment/Interventions: Self-care/ADL training;Therapeutic exercise;DME and/or AE instruction;Therapeutic activities;Patient/family education;Balance training    OT Goals(Current goals can be found in the care plan section) Acute Rehab OT Goals Patient Stated Goal: to be able to swim, walk without a cane, sleep without pain OT Goal Formulation: With patient Time For Goal Achievement: 09/01/16 Potential to Achieve Goals: Good ADL Goals Pt Will Perform Grooming: with supervision;standing Pt Will Perform Lower Body Dressing: with min guard assist;with adaptive equipment;sit to/from stand Pt Will Transfer to Toilet: with supervision;ambulating;bedside commode Pt Will Perform Toileting - Clothing Manipulation and hygiene: with supervision;with adaptive equipment;sit to/from stand  OT Frequency: Min 2X/week   Barriers to D/C: Decreased caregiver support  lives alone       Co-evaluation              End of Session Equipment Utilized During Treatment: Gait belt;Rolling walker Nurse Communication: Mobility status  Activity Tolerance: Patient tolerated treatment well Patient left: in chair;with call bell/phone within reach   Time: 1150-1208 OT Time Calculation (min): 18 min Charges:  OT General Charges $OT Visit: 1 Procedure OT Evaluation $OT Eval Low Complexity: 1 Procedure G-Codes:    Evern Bio Joani Cosma Aug 29, 2016, 5:49 PM Sherryl Manges OTR/L [phone]

## 2016-08-25 NOTE — Care Management Important Message (Signed)
Important Message  Patient Details  Name: Kathy Young MRN: [id number] Date of Birth: 05/01/1946   Medicare Important Message Given:  Yes    Shambhavi Salley Stefan Church[place]at[place] 08/25/2016, 9:50 AM

## 2016-08-25 NOTE — Discharge Planning (Signed)
Patient seen on rounds this am. She is doing well. Anticipating discharge to SNF tomorrow. She is going to [facility] for short term rehab. She has all needed equipment at home. She will follow up with Dr. Dion SaucierLandau in the office on 09/03/16 @ 10:00 and begin OPPT on the same day at 11:00.  Will continue to follow as an OP.   Shauna Hughenee Angiulli, RN BPCI CM  [facility]  [phone]

## 2016-08-26 DIAGNOSIS — M1712 Unilateral primary osteoarthritis, left knee: Secondary | ICD-10-CM | POA: Diagnosis not present

## 2016-08-26 DIAGNOSIS — E785 Hyperlipidemia, unspecified: Secondary | ICD-10-CM | POA: Diagnosis not present

## 2016-08-26 DIAGNOSIS — D62 Acute posthemorrhagic anemia: Secondary | ICD-10-CM | POA: Diagnosis not present

## 2016-08-26 DIAGNOSIS — M25562 Pain in left knee: Secondary | ICD-10-CM | POA: Diagnosis not present

## 2016-08-26 DIAGNOSIS — F329 Major depressive disorder, single episode, unspecified: Secondary | ICD-10-CM | POA: Diagnosis not present

## 2016-08-26 DIAGNOSIS — M6281 Muscle weakness (generalized): Secondary | ICD-10-CM | POA: Diagnosis not present

## 2016-08-26 DIAGNOSIS — Z96652 Presence of left artificial knee joint: Secondary | ICD-10-CM | POA: Diagnosis not present

## 2016-08-26 DIAGNOSIS — E1140 Type 2 diabetes mellitus with diabetic neuropathy, unspecified: Secondary | ICD-10-CM | POA: Diagnosis not present

## 2016-08-26 DIAGNOSIS — I10 Essential (primary) hypertension: Secondary | ICD-10-CM | POA: Diagnosis not present

## 2016-08-26 DIAGNOSIS — R262 Difficulty in walking, not elsewhere classified: Secondary | ICD-10-CM | POA: Diagnosis not present

## 2016-08-26 DIAGNOSIS — E039 Hypothyroidism, unspecified: Secondary | ICD-10-CM | POA: Diagnosis not present

## 2016-08-26 DIAGNOSIS — R2681 Unsteadiness on feet: Secondary | ICD-10-CM | POA: Diagnosis not present

## 2016-08-26 DIAGNOSIS — R278 Other lack of coordination: Secondary | ICD-10-CM | POA: Diagnosis not present

## 2016-08-26 DIAGNOSIS — D696 Thrombocytopenia, unspecified: Secondary | ICD-10-CM | POA: Diagnosis not present

## 2016-08-26 DIAGNOSIS — E871 Hypo-osmolality and hyponatremia: Secondary | ICD-10-CM | POA: Diagnosis not present

## 2016-08-26 DIAGNOSIS — M25662 Stiffness of left knee, not elsewhere classified: Secondary | ICD-10-CM | POA: Diagnosis not present

## 2016-08-26 DIAGNOSIS — Z471 Aftercare following joint replacement surgery: Secondary | ICD-10-CM | POA: Diagnosis not present

## 2016-08-26 DIAGNOSIS — E119 Type 2 diabetes mellitus without complications: Secondary | ICD-10-CM | POA: Diagnosis not present

## 2016-08-26 LAB — CBC
HEMATOCRIT: 29.4 % — AB (ref 36.0–46.0)
HEMOGLOBIN: 9.8 g/dL — AB (ref 12.0–15.0)
MCH: 32.3 pg (ref 26.0–34.0)
MCHC: 33.3 g/dL (ref 30.0–36.0)
MCV: 97 fL (ref 78.0–100.0)
Platelets: 145 10*3/uL — ABNORMAL LOW (ref 150–400)
RBC: 3.03 MIL/uL — ABNORMAL LOW (ref 3.87–5.11)
RDW: 13.4 % (ref 11.5–15.5)
WBC: 9.3 10*3/uL (ref 4.0–10.5)

## 2016-08-26 LAB — GLUCOSE, CAPILLARY
GLUCOSE-CAPILLARY: 117 mg/dL — AB (ref 65–99)
GLUCOSE-CAPILLARY: 112 mg/dL — AB (ref 65–99)
Glucose-Capillary: 122 mg/dL — ABNORMAL HIGH (ref 65–99)

## 2016-08-26 NOTE — Clinical Social Work Placement (Signed)
   CLINICAL SOCIAL WORK PLACEMENT  NOTE  Date:  08/26/2016  Patient Details  Name: Kathy PaisCandace E Brinton MRN: [id number] Date of Birth: 05/27/1946  Clinical Social Work is seeking post-discharge placement for this patient at the Skilled  Nursing Facility level of care (*CSW will initial, date and re-position this form in  chart as items are completed):  Yes   Patient/family provided with [place] Clinical Social Work Department's list of facilities offering this level of care within the geographic area requested by the patient (or if unable, by the patient's family).  Yes   Patient/family informed of their freedom to choose among providers that offer the needed level of care, that participate in Medicare, Medicaid or managed care program needed by the patient, have an available bed and are willing to accept the patient.  Yes   Patient/family informed of [facility]'s ownership interest in [facility] and [facility], as well as of the fact that they are under no obligation to receive care at these facilities.  PASRR submitted to EDS on       PASRR number received on       Existing PASRR number confirmed on       FL2 transmitted to all facilities in geographic area requested by pt/family on       FL2 transmitted to all facilities within larger geographic area on       Patient informed that his/her managed care company has contracts with or will negotiate with certain facilities, including the following:        Yes   Patient/family informed of bed offers received.  Patient chooses bed at  [facility])     Physician recommends and patient chooses bed at      Patient to be transferred to  [facility]) on 08/26/16.  Patient to be transferred to facility by  Sharin Mons([facility])     Patient family notified on 08/26/16 of transfer.  Name of family member notified:        PHYSICIAN       Additional Comment:    _______________________________________________ Alene MiresWhitaker, Jarnell Cordaro  R 08/26/2016, 12:51 PM

## 2016-08-26 NOTE — Plan of Care (Signed)
Problem: Pain Managment: Goal: General experience of comfort will improve Outcome: Progressing Rates pain as a 2 this AM

## 2016-08-26 NOTE — Clinical Social Work Note (Signed)
Clinical Social Work Assessment  Patient Details  Name: Kathy Young MRN: [id number] Date of Birth: 05/23/1946  Date of referral:  08/26/16               Reason for consult:  Facility Placement                Permission sought to share information with:   (Facilities) Permission granted to share information::   (Facilities)  Name::        Agency::     Relationship::     Contact Information:     Housing/Transportation Living arrangements for the past 2 months:  Single Family Home (Patient states that she lives home alone in [place].) Source of Information:  Patient Patient Interpreter Needed:  None Criminal Activity/Legal Involvement Pertinent to Current Situation/Hospitalization:  No - Comment as needed Significant Relationships:    Lives with:  Self Do you feel safe going back to the place where you live?   (Patient would like to go to Facility. [facility]) Need for family participation in patient care:  Yes (Comment)  Care giving concerns:  Needs assistance with ADLs.    Social Worker assessment / plan:  SW met with patient at bedside. Patient was alert and oriented. There was no family present. Patient confirms that she is interested in going to SNF. SW referred pt out. Patient chooses [facility]. SW spoke with admisssions/Regina who confirms that she has been accepted. Patient accepting.  Employment status:  Retired Forensic scientist:  Medicare PT Recommendations:  [place] / Referral to community resources:   (Referred to SNF)  Patient/Family's Response to care:  Appropriate.  Patient/Family's Understanding of and Emotional Response to Diagnosis, Current Treatment, and Prognosis:  No questions.  Emotional Assessment Appearance:  Appears stated age Attitude/Demeanor/Rapport:   (Appropriate) Affect (typically observed):  Accepting Orientation:  Oriented to Self, Oriented to Place, Oriented to  Time, Oriented to Situation Alcohol / Substance  use:    Psych involvement (Current and /or in the community):  No (Comment)  Discharge Needs  Concerns to be addressed:  No discharge needs identified Readmission within the last 30 days:  No Current discharge risk:  None Barriers to Discharge:      Bernita Buffy 08/26/2016, 1:04 PM

## 2016-08-26 NOTE — Progress Notes (Signed)
Pt d/c'ed to [facility] this pm. Report called to facility at 1733. All discharge instructions and med educations given to patient and all questions answered. Belongings sent with pt's brother. Pt transported to facility by her brother. IV removed with no complications. VSS.  Margarito LinerStephanie M Siniya Lichty, RN

## 2016-08-26 NOTE — Progress Notes (Signed)
PASRR: [id number] Nanetta BattyA  Glessie Eustice, MSW [phone]

## 2016-08-26 NOTE — Progress Notes (Addendum)
Physical Therapy Treatment Patient Details Name: Kathy Young MRN: [id number] DOB: August 19, 1946 Today's Date: 08/26/2016    History of Present Illness 70 y.o. female admitted for L TKA. PMH of R TKA March 2017, obesity, anxiety/depression, trigeminal neuralgia, lymphedema BLEs.     PT Comments    Pt presented supine in bed with HOB elevated, awake and willing to participate in therapy session. Pt making good progress towards achieving her functional goals. Pt would continue to benefit from skilled physical therapy services at this time while admitted and after d/c to address her limitations in order to improve her overall safety and independence with functional mobility.   Follow Up Recommendations  SNF     Equipment Recommendations  None recommended by PT    Recommendations for Other Services OT consult     Precautions / Restrictions Precautions Precautions: Knee Precaution Booklet Issued: No Restrictions Weight Bearing Restrictions: Yes LLE Weight Bearing: Weight bearing as tolerated    Mobility  Bed Mobility Overal bed mobility: Needs Assistance Bed Mobility: Supine to Sit     Supine to sit: Min assist;HOB elevated     General bed mobility comments: pt required min A with L LE movement  Transfers Overall transfer level: Needs assistance Equipment used: Rolling walker (2 wheeled) Transfers: Sit to/from Stand Sit to Stand: Min guard         General transfer comment: pt required increased time to complete and min guard for safety  Ambulation/Gait Ambulation/Gait assistance: Min guard Ambulation Distance (Feet): 50 Feet Assistive device: Rolling walker (2 wheeled) Gait Pattern/deviations: Step-to pattern;Decreased step length - right;Decreased stance time - left;Decreased weight shift to left Gait velocity: decreased   General Gait Details: pt steady with RW, no LOB   Stairs            Wheelchair Mobility    Modified Rankin (Stroke Patients  Only)       Balance Overall balance assessment: Needs assistance Sitting-balance support: Feet supported;No upper extremity supported Sitting balance-Leahy Scale: Fair     Standing balance support: During functional activity;Bilateral upper extremity supported Standing balance-Leahy Scale: Poor                      Cognition Arousal/Alertness: Awake/alert Behavior During Therapy: WFL for tasks assessed/performed Overall Cognitive Status: Within Functional Limits for tasks assessed                      Exercises Total Joint Exercises Ankle Circles/Pumps: AROM;Both;10 reps;Seated Quad Sets: AROM;Strengthening;Left;10 reps;Seated Hip ABduction/ADduction: AROM;Strengthening;10 reps;Seated Goniometric ROM: Flexion = 76 degrees, Extension = lacking 30 degrees; measured in sitting    General Comments        Pertinent Vitals/Pain Pain Assessment: Faces Faces Pain Scale: Hurts even more Pain Location: L knee Pain Descriptors / Indicators: Grimacing;Guarding Pain Intervention(s): Monitored during session;Repositioned    Home Living                      Prior Function            PT Goals (current goals can now be found in the care plan section) Acute Rehab PT Goals Patient Stated Goal: to be able to swim, walk without a cane, sleep without pain PT Goal Formulation: With patient Time For Goal Achievement: 09/07/16 Potential to Achieve Goals: Good Progress towards PT goals: Progressing toward goals    Frequency  7X/week    PT Plan Current plan remains appropriate  Co-evaluation             End of Session Equipment Utilized During Treatment: Gait belt Activity Tolerance: Patient limited by fatigue Patient left: in chair;with call bell/phone within reach     Time: 1020-1044 PT Time Calculation (min) (ACUTE ONLY): 24 min  Charges:  $Gait Training: 8-22 mins $Therapeutic Exercise: 8-22 mins                    G Codes:       Alessandra BevelsJennifer M Shantanu Strauch 08/26/2016, 3:12 PM Deborah ChalkJennifer Sagrario [place], PT, DPT [phone]

## 2016-08-26 NOTE — Discharge Summary (Signed)
Physician Discharge Summary  Patient ID: Kathy Young MRN: [id number] DOB/AGE: [birth year] 70 y.o.  Admit date: 08/24/2016 Discharge date: 08/26/2016  Admission Diagnoses:  Primary localized osteoarthritis of left knee  Discharge Diagnoses:  Principal Problem:   Primary localized osteoarthritis of left knee Active Problems:   Primary localized osteoarthritis of right knee   S/P total knee arthroplasty   Past Medical History:  Diagnosis Date  . Anemia   . Anxiety   . Arthritis   . Complication of anesthesia    Hypoventalation and de sats after shoulder surgery- has sleep apnea, but can not use CPAP due to Trigeminal Neuralgia  . Depression   . Diabetes mellitus without complication (HCC)    type II  . Hypercholesteremia   . Hypertension   . Hypothyroidism   . Lymphedema    legs, bilateral - wears support hose  . Noncompliance with CPAP treatment   . Obesity   . OSA (obstructive sleep apnea)   . Pneumonia    infant  . Primary localized osteoarthritis of left knee 08/24/2016  . Primary localized osteoarthritis of right knee 02/24/2016  . Reflux   . Seizures (HCC)    not for 20 years. does not see a neurologist 08/12/16- not for 30 years  . Severe obesity (BMI >= 40) (HCC) 02/24/2016  . SOB (shortness of breath)   . Trigeminal neuralgia   . Trigeminal neuralgia pain   . Urinary incontinence   . Vitamin D deficiency     Surgeries: Procedure(s): TOTAL KNEE ARTHROPLASTY on 08/24/2016   Consultants (if any):   Discharged Condition: Improved  Hospital Course: Kathy Young is an 70 y.o. female who was admitted 08/24/2016 with a diagnosis of Primary localized osteoarthritis of left knee and went to the operating room on 08/24/2016 and underwent the above named procedures.    She was given perioperative antibiotics:  Anti-infectives    Start     Dose/Rate Route Frequency Ordered Stop   08/24/16 1400  ceFAZolin (ANCEF) IVPB 2g/100 mL premix     2 g 200 mL/hr over 30 Minutes  Intravenous Every 6 hours 08/24/16 1253 08/24/16 2213   08/24/16 1300  valACYclovir (VALTREX) tablet 500 mg     500 mg Oral Daily 08/24/16 1254     08/24/16 0700  ceFAZolin (ANCEF) 3 g in dextrose 5 % 50 mL IVPB     3 g 130 mL/hr over 30 Minutes Intravenous To [facility] 08/20/16 1154 08/24/16 0746    .  She was given sequential compression devices, early ambulation, and xarelto  for DVT prophylaxis.  She benefited maximally from the hospital stay and there were no complications.    Recent vital signs:  Vitals:   08/26/16 0456 08/26/16 1431  BP: 125/61 (!) 115/55  Pulse: 78 91  Resp: 17 18  Temp: 98.1 F (36.7 C) 98.3 F (36.8 C)    Recent laboratory studies:  Lab Results  Component Value Date   HGB 9.8 (L) 08/26/2016   HGB 10.4 (L) 08/25/2016   HGB 11.7 (L) 08/12/2016   Lab Results  Component Value Date   WBC 9.3 08/26/2016   PLT 145 (L) 08/26/2016   Lab Results  Component Value Date   INR 1.00 07/28/2011   Lab Results  Component Value Date   NA 132 (L) 08/25/2016   K 4.5 08/25/2016   CL 99 (L) 08/25/2016   CO2 25 08/25/2016   BUN 17 08/25/2016   CREATININE 0.93 08/25/2016   GLUCOSE  155 (H) 08/25/2016    Discharge Medications:     Medication List    TAKE these medications   atorvastatin 10 MG tablet Commonly known as:  LIPITOR Take 10 mg by mouth daily at 6 PM. For hypercholesteremia   baclofen 10 MG tablet Commonly known as:  LIORESAL Take 1 tablet (10 mg total) by mouth 3 (three) times daily. As needed for muscle spasm   cholecalciferol 1000 units tablet Commonly known as:  VITAMIN D Take 1,000 Units by mouth daily.   diclofenac sodium 1 % Gel Commonly known as:  VOLTAREN Apply 2 g topically as needed (for pain).   escitalopram 20 MG tablet Commonly known as:  LEXAPRO Take 20 mg by mouth at bedtime. For depression   gabapentin 400 MG capsule Commonly known as:  NEURONTIN Take 400 mg by mouth 2 (two) times daily.    levothyroxine 200 MCG tablet Commonly known as:  SYNTHROID, LEVOTHROID Take 200 mcg by mouth daily before breakfast. **brand name only* For hypothyroidism   lisinopril 20 MG tablet Commonly known as:  PRINIVIL,ZESTRIL Take 20 mg by mouth daily. For HTN   metFORMIN 500 MG 24 hr tablet Commonly known as:  GLUCOPHAGE-XR Take 500 mg by mouth 2 (two) times daily with a meal. For diabetes   multivitamin tablet Take 1 tablet by mouth daily.   ondansetron 4 MG tablet Commonly known as:  ZOFRAN Take 1 tablet (4 mg total) by mouth every 8 (eight) hours as needed for nausea or vomiting.   oxybutynin 15 MG 24 hr tablet Commonly known as:  DITROPAN XL Take 15 mg by mouth every morning. For urinary incontinence   oxyCODONE-acetaminophen 10-325 MG tablet Commonly known as:  PERCOCET Take 1-2 tablets by mouth every 6 (six) hours as needed for pain. MAXIMUM TOTAL ACETAMINOPHEN DOSE IS 4000 MG PER DAY   rivaroxaban 10 MG Tabs tablet Commonly known as:  XARELTO Take 1 tablet (10 mg total) by mouth daily.   sennosides-docusate sodium 8.6-50 MG tablet Commonly known as:  SENOKOT-S Take 2 tablets by mouth daily.   traMADol 50 MG tablet Commonly known as:  ULTRAM Take 50 mg by mouth every 6 (six) hours as needed (for pain).   valACYclovir 500 MG tablet Commonly known as:  VALTREX Take 500 mg by mouth daily.       Diagnostic Studies: Dg Knee Left Port  Result Date: 08/24/2016 CLINICAL DATA:  Postop day 0 left total knee arthroplasty. EXAM: PORTABLE LEFT KNEE - 1-2 VIEW COMPARISON:  None. FINDINGS: Anatomic alignment post left total knee arthroplasty. No acute complicating features. Dystrophic calcification and phleboliths involving the subcutaneous tissues of the anterior lower leg. IMPRESSION: Anatomic alignment post left total knee arthroplasty without acute complicating features. Electronically Signed   By: Hulan Saashomas  Lawrence M.D.   On: 08/24/2016 11:15    Disposition: 03-Skilled Nursing  Facility    Follow-up Information    Eulas PostLANDAU,Morrison Masser P, MD. Schedule an appointment as soon as possible for a visit in 2 weeks.   Specialty:  Orthopedic Surgery Contact information: [address] [place] [address] [phone]            Signed: Eulas PostLANDAU,Henchy Mccauley P 08/26/2016, 4:33 PM

## 2016-08-26 NOTE — Progress Notes (Signed)
Pt has been accepted to [facility] for transfer this pm.  Albin Fellingarla at the facility confirmed that they have all necessary documentation for pt.  RN to call report.  Pt's brother en route to hospital to transport pt to facility.  Pollyann SavoyJody Mikyla Schachter, LCSW Evening/ED Coverage [phone]

## 2016-08-30 ENCOUNTER — Encounter: Payer: Self-pay | Admitting: Internal Medicine

## 2016-08-30 ENCOUNTER — Non-Acute Institutional Stay (SKILLED_NURSING_FACILITY): Payer: Medicare Other | Admitting: Internal Medicine

## 2016-08-30 DIAGNOSIS — E871 Hypo-osmolality and hyponatremia: Secondary | ICD-10-CM | POA: Diagnosis not present

## 2016-08-30 DIAGNOSIS — E039 Hypothyroidism, unspecified: Secondary | ICD-10-CM | POA: Diagnosis not present

## 2016-08-30 DIAGNOSIS — D696 Thrombocytopenia, unspecified: Secondary | ICD-10-CM | POA: Diagnosis not present

## 2016-08-30 DIAGNOSIS — E1140 Type 2 diabetes mellitus with diabetic neuropathy, unspecified: Secondary | ICD-10-CM | POA: Diagnosis not present

## 2016-08-30 DIAGNOSIS — I10 Essential (primary) hypertension: Secondary | ICD-10-CM | POA: Diagnosis not present

## 2016-08-30 DIAGNOSIS — R2681 Unsteadiness on feet: Secondary | ICD-10-CM | POA: Diagnosis not present

## 2016-08-30 DIAGNOSIS — D62 Acute posthemorrhagic anemia: Secondary | ICD-10-CM | POA: Diagnosis not present

## 2016-08-30 DIAGNOSIS — M1712 Unilateral primary osteoarthritis, left knee: Secondary | ICD-10-CM | POA: Diagnosis not present

## 2016-08-30 DIAGNOSIS — F329 Major depressive disorder, single episode, unspecified: Secondary | ICD-10-CM | POA: Diagnosis not present

## 2016-08-30 DIAGNOSIS — K5900 Constipation, unspecified: Secondary | ICD-10-CM

## 2016-08-30 DIAGNOSIS — N3281 Overactive bladder: Secondary | ICD-10-CM | POA: Diagnosis not present

## 2016-08-30 DIAGNOSIS — B001 Herpesviral vesicular dermatitis: Secondary | ICD-10-CM

## 2016-08-30 DIAGNOSIS — F32A Depression, unspecified: Secondary | ICD-10-CM

## 2016-08-30 DIAGNOSIS — K5909 Other constipation: Secondary | ICD-10-CM

## 2016-08-30 NOTE — Progress Notes (Signed)
LOCATION: [facility]  PCP: Cain Saupe, MD   Code Status: Full Code  Goals of care: Advanced Directive information Advanced Directives 08/12/2016  Does patient have an advance directive? Yes  Type of Advance Directive Living will;Healthcare Power of Attorney  Does patient want to make changes to advanced directive? -  Copy of advanced directive(s) in chart? No - copy requested       Extended Emergency Contact Information Primary Emergency Contact: Pryor Montes [place] Home Phone: [phone] Relation: Son Secondary Emergency Contact: Pate,Curtis Address: [address]          [place], [place] [address] [place] Home Phone: [phone] Relation: Brother   Allergies  Allergen Reactions  . Dilantin [Phenytoin Sodium Extended] Hives and Itching  . Tegretol [Carbamazepine] Hives and Itching  . Adhesive [Tape] Rash  . Latex Itching and Rash    Chief Complaint  Patient presents with  . New Admit To SNF    New Admission     HPI:  Patient is a 70 y.o. female seen today for short term rehabilitation post hospital admission from 08/24/16-08/26/16 with left knee OA. She underwent left total knee arthroplasty. She is seen in her room today.   Review of Systems:  Constitutional: Negative for fever, chills, diaphoresis. Energy level is slowly coming back. HENT: Negative for headache, congestion, nasal discharge, difficulty swallowing.   Eyes: Negative for blurred vision, double vision and discharge. Wears glasses.  Respiratory: Negative for cough, shortness of breath and wheezing.   Cardiovascular: Negative for chest pain, palpitations.  Gastrointestinal: Negative for heartburn, nausea, vomiting, abdominal pain. Last bowel movement was 2 days back.  Genitourinary: Negative for dysuria and flank pain.  Musculoskeletal: Negative for back pain, fall in the facility.  Skin: Negative for itching, rash.  Neurological: Negative for  dizziness. Psychiatric/Behavioral: Negative for depression   Past Medical History:  Diagnosis Date  . Anemia   . Anxiety   . Arthritis   . Complication of anesthesia    Hypoventalation and de sats after shoulder surgery- has sleep apnea, but can not use CPAP due to Trigeminal Neuralgia  . Depression   . Diabetes mellitus without complication (HCC)    type II  . Hypercholesteremia   . Hypertension   . Hypothyroidism   . Lymphedema    legs, bilateral - wears support hose  . Noncompliance with CPAP treatment   . Obesity   . OSA (obstructive sleep apnea)   . Pneumonia    infant  . Primary localized osteoarthritis of left knee 08/24/2016  . Primary localized osteoarthritis of right knee 02/24/2016  . Reflux   . Seizures (HCC)    not for 20 years. does not see a neurologist 08/12/16- not for 30 years  . Severe obesity (BMI >= 40) (HCC) 02/24/2016  . SOB (shortness of breath)   . Trigeminal neuralgia   . Trigeminal neuralgia pain   . Urinary incontinence   . Vitamin D deficiency    Past Surgical History:  Procedure Laterality Date  . APPENDECTOMY    . CARPAL TUNNEL RELEASE    . CHOLECYSTECTOMY    . COLONOSCOPY    . OVARIAN CYST REMOVAL    . SHOULDER SURGERY    . TONSILLECTOMY    . TOTAL KNEE ARTHROPLASTY Right 02/24/2016   Procedure: TOTAL KNEE ARTHROPLASTY;  Surgeon: Teryl Lucy, MD;  Location: [facility] OR;  Service: Orthopedics;  Laterality: Right;  . TOTAL KNEE ARTHROPLASTY Left 08/24/2016   Procedure:  TOTAL KNEE ARTHROPLASTY;  Surgeon: Teryl Lucy, MD;  Location: [facility] OR;  Service: Orthopedics;  Laterality: Left;  . TUBAL LIGATION     Social History:   reports that she has never smoked. She has never used smokeless tobacco. She reports that she does not drink alcohol or use drugs.  Family History  Problem Relation Age of Onset  . Cancer Mother   . Hypertension Mother   . Diabetes Father   . Heart disease Father   . Heart attack Father   . Diabetes Sister   . Diabetes  Brother     Medications:   Medication List       Accurate as of 08/30/16  2:51 PM. Always use your most recent med list.          atorvastatin 10 MG tablet Commonly known as:  LIPITOR Take 10 mg by mouth daily at 6 PM. For hypercholesteremia   baclofen 10 MG tablet Commonly known as:  LIORESAL Take 1 tablet (10 mg total) by mouth 3 (three) times daily. As needed for muscle spasm   cholecalciferol 1000 units tablet Commonly known as:  VITAMIN D Take 1,000 Units by mouth daily.   diclofenac sodium 1 % Gel Commonly known as:  VOLTAREN Apply 2 g topically 2 (two) times daily as needed (for pain).   escitalopram 20 MG tablet Commonly known as:  LEXAPRO Take 20 mg by mouth at bedtime. For depression   gabapentin 400 MG capsule Commonly known as:  NEURONTIN Take 400 mg by mouth 2 (two) times daily.   levothyroxine 200 MCG tablet Commonly known as:  SYNTHROID, LEVOTHROID Take 200 mcg by mouth daily before breakfast. **brand name only* For hypothyroidism   lisinopril 20 MG tablet Commonly known as:  PRINIVIL,ZESTRIL Take 20 mg by mouth daily. For HTN   metFORMIN 500 MG 24 hr tablet Commonly known as:  GLUCOPHAGE-XR Take 500 mg by mouth 2 (two) times daily with a meal. For diabetes   multivitamin tablet Take 1 tablet by mouth daily.   ondansetron 4 MG tablet Commonly known as:  ZOFRAN Take 1 tablet (4 mg total) by mouth every 8 (eight) hours as needed for nausea or vomiting.   oxybutynin 15 MG 24 hr tablet Commonly known as:  DITROPAN XL Take 15 mg by mouth every morning. For urinary incontinence   oxyCODONE-acetaminophen 10-325 MG tablet Commonly known as:  PERCOCET Take 1-2 tablets by mouth every 6 (six) hours as needed for pain. MAXIMUM TOTAL ACETAMINOPHEN DOSE IS 4000 MG PER DAY   rivaroxaban 10 MG Tabs tablet Commonly known as:  XARELTO Take 1 tablet (10 mg total) by mouth daily.   sennosides-docusate sodium 8.6-50 MG tablet Commonly known as:   SENOKOT-S Take 2 tablets by mouth daily.   traMADol 50 MG tablet Commonly known as:  ULTRAM Take 50 mg by mouth every 6 (six) hours as needed (for pain).   valACYclovir 500 MG tablet Commonly known as:  VALTREX Take 500 mg by mouth daily.       Immunizations: Immunization History  Administered Date(s) Administered  . Influenza,inj,Quad PF,36+ Mos 02/26/2016  . PPD Test 02/26/2016, 08/26/2016     Physical Exam:  Vitals:   08/30/16 1445  BP: (!) 100/52  Pulse: 72  Resp: 20  Temp: 98.2 F (36.8 C)  TempSrc: Oral  SpO2: 96%  Weight: 281 lb 12.8 oz (127.8 kg)  Height: 5\' 9"  (1.753 m)   Body mass index is 41.61 kg/m.  General- elderly female,  obese, in no acute distress Head- normocephalic, atraumatic Nose- no maxillary or frontal sinus tenderness, no nasal discharge Throat- moist mucus membrane Eyes- PERRLA, EOMI, no pallor, no icterus, no discharge Neck- no cervical lymphadenopathy Cardiovascular- normal s1,s2, + systolic murmur, trace leg edema Respiratory- bilateral clear to auscultation, no wheeze, no rhonchi, no crackles, no use of accessory muscles Abdomen- bowel sounds present, soft, non tender Musculoskeletal- able to move all 4 extremities, limited left knee range of motion Neurological- alert and oriented to person, place and time Skin- warm and dry, left knee surgical incision with steri strip in place, some blood stain on the dressing, redness with minimal swelling on her left knee. Compression stocking in place. bruise to arms Psychiatry- normal mood and affect    Labs reviewed: Basic Metabolic Panel:  Recent Labs  96/04/[birth year] 0444 03/03/16 08/12/16 1626 08/25/16 0607  NA 139 136* 139 132*  K 4.4 5.1 4.1 4.5  CL 104  --  105 99*  CO2 25  --  26 25  GLUCOSE 130*  --  182* 155*  BUN 27* 28* 25* 17  CREATININE 1.14* 1.1 1.06* 0.93  CALCIUM 9.1  --  9.7 9.0   Liver Function Tests:  Recent Labs  03/03/16  AST 25  ALT 24  ALKPHOS 62   No  results for input(s): LIPASE, AMYLASE in the last 8760 hours. No results for input(s): AMMONIA in the last 8760 hours. CBC:  Recent Labs  09/02/15 1338  08/12/16 1626 08/25/16 0607 08/26/16 0334  WBC 5.5  < > 5.2 7.6 9.3  NEUTROABS 3.5  --   --   --   --   HGB 11.9*  < > 11.7* 10.4* 9.8*  HCT 36.1  < > 35.6* 31.3* 29.4*  MCV 96.8  < > 97.3 97.2 97.0  PLT 175  < > 172 135* 145*  < > = values in this interval not displayed. Cardiac Enzymes:  Recent Labs  09/02/15 1338 09/02/15 1530  TROPONINI <0.03 <0.03   BNP: Invalid input(s): POCBNP CBG:  Recent Labs  08/26/16 0648 08/26/16 1220 08/26/16 1659  GLUCAP 117* 122* 112*    Radiological Exams: Dg Knee Left Port  Result Date: 08/24/2016 CLINICAL DATA:  Postop day 0 left total knee arthroplasty. EXAM: PORTABLE LEFT KNEE - 1-2 VIEW COMPARISON:  None. FINDINGS: Anatomic alignment post left total knee arthroplasty. No acute complicating features. Dystrophic calcification and phleboliths involving the subcutaneous tissues of the anterior lower leg. IMPRESSION: Anatomic alignment post left total knee arthroplasty without acute complicating features. Electronically Signed   By: Hulan Saas M.D.   On: 08/24/2016 11:15    Assessment/Plan  Unsteady gait Will have patient work with PT/OT as tolerated to regain strength and restore function.  Fall precautions are in place.  Left knee OA S/p left total knee arthroplasty. Will have her work with physical therapy and occupational therapy team to help with gait training and muscle strengthening exercises.fall precautions. Skin care. Encourage to be out of bed. Has follow up with orthopedics. Continue percocet 10-325 mg 1-2 tab q6h prn with tramadol 50 mg q6h prn pain. Continue xarelto 10 mg daily for DVT prophylaxis. Continue baclofen 10 mg tid prn muscle spasm.  Hyponatremia Check bmp  Blood loss anemia Post op, monitor cbc  Thrombocytopenia No bleed, on xarelto, monitor  platelet count  Hypertension bp on review is low. Denies symptom. Check VS bid x 1 week. Continue lisinopril 20 mg daily with holding parameter. Monitor bmp  Chronic  constipation Continue senokot s 2 tab daily  Dm type 2 with neuropathic pain Lab Results  Component Value Date   HGBA1C 5.8 (H) 08/12/2016   Reviewed a1c. Continue metformin 500 mg bid and monitor cbg. Continue gabapentin 400 mg bid for nerve pain  Hypothyroidism Continue levothyroxine 200 mcg daily  Recurrent herpes labialis Continue her daily valtrex and monitor  Chronic depression Continue escitalopram 20 mg daily  OAB Continue her ditropan    Goals of care: short term rehabilitation   Labs/tests ordered: cbc, cmp  Family/ staff Communication: reviewed care plan with patient and nursing supervisor    Oneal GroutMAHIMA Makoto Sellitto, MD Internal Medicine [facility] [address] [place], [place] [address] Cell Phone (Monday-Friday 8 am - 5 pm): [phone] On Call: [phone] and follow prompts after 5 pm and on weekends Office Phone: [phone] Office Fax: [phone]

## 2016-09-01 ENCOUNTER — Non-Acute Institutional Stay (SKILLED_NURSING_FACILITY): Payer: Medicare Other | Admitting: Family

## 2016-09-01 DIAGNOSIS — E039 Hypothyroidism, unspecified: Secondary | ICD-10-CM | POA: Diagnosis not present

## 2016-09-01 DIAGNOSIS — F329 Major depressive disorder, single episode, unspecified: Secondary | ICD-10-CM | POA: Diagnosis not present

## 2016-09-01 DIAGNOSIS — E785 Hyperlipidemia, unspecified: Secondary | ICD-10-CM | POA: Diagnosis not present

## 2016-09-01 DIAGNOSIS — E119 Type 2 diabetes mellitus without complications: Secondary | ICD-10-CM | POA: Diagnosis not present

## 2016-09-01 DIAGNOSIS — Z96652 Presence of left artificial knee joint: Secondary | ICD-10-CM | POA: Diagnosis not present

## 2016-09-01 DIAGNOSIS — I10 Essential (primary) hypertension: Secondary | ICD-10-CM | POA: Diagnosis not present

## 2016-09-01 DIAGNOSIS — F32A Depression, unspecified: Secondary | ICD-10-CM

## 2016-09-01 NOTE — Progress Notes (Signed)
Location:   [facility]  Nursing Home Room Number: 706  Place of Service:  SNF (31)  Provider: Richarda Blade FNP-C   PCP: Cain Saupe, MD Patient Care Team: Cain Saupe, MD as PCP - General (Family Medicine) Teryl Lucy, MD as Consulting Physician (Orthopedic Surgery)  Extended Emergency Contact Information Primary Emergency Contact: Pryor Montes [place] Home Phone: [phone] Relation: Son Secondary Emergency Contact: Pate,Curtis Address: [address]          [place], [place] [address] [place] Home Phone: [phone] Relation: Brother  Code Status: Full Code  Goals of care:  Advanced Directive information Advanced Directives 08/12/2016  Does patient have an advance directive? Yes  Type of Advance Directive Living will;Healthcare Power of Attorney  Does patient want to make changes to advanced directive? -  Copy of advanced directive(s) in chart? No - copy requested     Allergies  Allergen Reactions  . Dilantin [Phenytoin Sodium Extended] Hives and Itching  . Tegretol [Carbamazepine] Hives and Itching  . Adhesive [Tape] Rash  . Latex Itching and Rash    Chief Complaint  Patient presents with  . Discharge Note    HPI:  70 y.o. female seen today at [facility] for discharge home. She was here for short term rehabilitation post hospital admission from 08/24/16-08/26/16 with left knee OA. She underwent left total knee arthroplasty. She has a medical History of HTN, DM2, Hypothyroidism, OA among other conditions. She is seen in her room today. She states left knee pain under control with current regimen. She denies any acute issues this visit. She has had unremarkable stay in here in Rehab. She has worked well with PT/OT now stable for discharge home.She will be discharged home with Outpatient therapy to continue with ROM, Exercise, Gait stability and muscle strengthening. She does not require any DME  she states has own FWW.  Prescription medication will be written x 1 month then patient to follow up with PCP in 1-2 weeks.Facility staff report no new concerns.      Past Medical History:  Diagnosis Date  . Anemia   . Anxiety   . Arthritis   . Complication of anesthesia    Hypoventalation and de sats after shoulder surgery- has sleep apnea, but can not use CPAP due to Trigeminal Neuralgia  . Depression   . Diabetes mellitus without complication (HCC)    type II  . Hypercholesteremia   . Hypertension   . Hypothyroidism   . Lymphedema    legs, bilateral - wears support hose  . Noncompliance with CPAP treatment   . Obesity   . OSA (obstructive sleep apnea)   . Pneumonia    infant  . Primary localized osteoarthritis of left knee 08/24/2016  . Primary localized osteoarthritis of right knee 02/24/2016  . Reflux   . Seizures (HCC)    not for 20 years. does not see a neurologist 08/12/16- not for 30 years  . Severe obesity (BMI >= 40) (HCC) 02/24/2016  . SOB (shortness of breath)   . Trigeminal neuralgia   . Trigeminal neuralgia pain   . Urinary incontinence   . Vitamin D deficiency     Past Surgical History:  Procedure Laterality Date  . APPENDECTOMY    . CARPAL TUNNEL RELEASE    . CHOLECYSTECTOMY    . COLONOSCOPY    . OVARIAN CYST REMOVAL    . SHOULDER SURGERY    . TONSILLECTOMY    .  TOTAL KNEE ARTHROPLASTY Right 02/24/2016   Procedure: TOTAL KNEE ARTHROPLASTY;  Surgeon: Teryl Lucy, MD;  Location: [facility] OR;  Service: Orthopedics;  Laterality: Right;  . TOTAL KNEE ARTHROPLASTY Left 08/24/2016   Procedure: TOTAL KNEE ARTHROPLASTY;  Surgeon: Teryl Lucy, MD;  Location: [facility] OR;  Service: Orthopedics;  Laterality: Left;  . TUBAL LIGATION        reports that she has never smoked. She has never used smokeless tobacco. She reports that she does not drink alcohol or use drugs. Social History   Social History  . Marital status: Divorced    Spouse name: N/A  . Number of children: N/A   . Years of education: N/A   Occupational History  . Not on file.   Social History Main Topics  . Smoking status: Never Smoker  . Smokeless tobacco: Never Used  . Alcohol use No  . Drug use: No  . Sexual activity: Not on file   Other Topics Concern  . Not on file   Social History Narrative  . No narrative on file   Functional Status Survey:    Allergies  Allergen Reactions  . Dilantin [Phenytoin Sodium Extended] Hives and Itching  . Tegretol [Carbamazepine] Hives and Itching  . Adhesive [Tape] Rash  . Latex Itching and Rash    Pertinent  Health Maintenance Due  Topic Date Due  . FOOT EXAM  06/24/1956  . OPHTHALMOLOGY EXAM  06/24/1956  . COLONOSCOPY  06/24/1997  . DEXA SCAN  06/24/2012  . PNA vac Low Risk Adult (1 of 2 - PCV13) 06/24/2012  . MAMMOGRAM  06/29/2012  . INFLUENZA VACCINE  07/20/2016  . HEMOGLOBIN A1C  02/12/2017    Medications:   Medication List       Accurate as of 09/01/16  7:54 PM. Always use your most recent med list.          atorvastatin 10 MG tablet Commonly known as:  LIPITOR Take 10 mg by mouth daily at 6 PM. For hypercholesteremia   baclofen 10 MG tablet Commonly known as:  LIORESAL Take 1 tablet (10 mg total) by mouth 3 (three) times daily. As needed for muscle spasm   cholecalciferol 1000 units tablet Commonly known as:  VITAMIN D Take 1,000 Units by mouth daily.   diclofenac sodium 1 % Gel Commonly known as:  VOLTAREN Apply 2 g topically 2 (two) times daily as needed (for pain).   escitalopram 20 MG tablet Commonly known as:  LEXAPRO Take 20 mg by mouth at bedtime. For depression   gabapentin 400 MG capsule Commonly known as:  NEURONTIN Take 400 mg by mouth 2 (two) times daily.   levothyroxine 200 MCG tablet Commonly known as:  SYNTHROID, LEVOTHROID Take 200 mcg by mouth daily before breakfast. **brand name only* For hypothyroidism   lisinopril 20 MG tablet Commonly known as:  PRINIVIL,ZESTRIL Take 20 mg by  mouth daily. For HTN   metFORMIN 500 MG 24 hr tablet Commonly known as:  GLUCOPHAGE-XR Take 500 mg by mouth 2 (two) times daily with a meal. For diabetes   multivitamin tablet Take 1 tablet by mouth daily.   ondansetron 4 MG tablet Commonly known as:  ZOFRAN Take 1 tablet (4 mg total) by mouth every 8 (eight) hours as needed for nausea or vomiting.   oxybutynin 15 MG 24 hr tablet Commonly known as:  DITROPAN XL Take 15 mg by mouth every morning. For urinary incontinence   oxyCODONE-acetaminophen 10-325 MG tablet Commonly known as:  PERCOCET  Take 1-2 tablets by mouth every 6 (six) hours as needed for pain. MAXIMUM TOTAL ACETAMINOPHEN DOSE IS 4000 MG PER DAY   rivaroxaban 10 MG Tabs tablet Commonly known as:  XARELTO Take 1 tablet (10 mg total) by mouth daily.   sennosides-docusate sodium 8.6-50 MG tablet Commonly known as:  SENOKOT-S Take 2 tablets by mouth daily.   traMADol 50 MG tablet Commonly known as:  ULTRAM Take 50 mg by mouth every 6 (six) hours as needed (for pain).   valACYclovir 500 MG tablet Commonly known as:  VALTREX Take 500 mg by mouth daily.       Review of Systems  Constitutional: Negative for activity change, appetite change, chills, fatigue and fever.  HENT: Negative for congestion, rhinorrhea, sinus pressure, sneezing and sore throat.   Eyes: Negative.   Respiratory: Negative.   Cardiovascular: Negative for chest pain, palpitations and leg swelling.  Gastrointestinal: Negative for abdominal distention, abdominal pain, constipation, diarrhea, nausea and vomiting.  Endocrine: Negative.   Genitourinary: Negative for dysuria, flank pain, frequency and urgency.       She reports dark colored urine at times.   Musculoskeletal: Positive for gait problem.       Left knee pain under control with current regimen  Skin: Negative.   Allergic/Immunologic: Negative.   Neurological: Negative for dizziness, syncope and headaches.  Hematological: Negative.     Psychiatric/Behavioral: Negative.     Vitals:   09/01/16 1130  BP: (!) 156/88  Pulse: 81  Resp: 18  Temp: 98.4 F (36.9 C)  SpO2: 96%  Weight: 292 lb (132.5 kg)  Height: 5\' 9"  (1.753 m)   Body mass index is 43.12 kg/m. Physical Exam  Constitutional: She is oriented to person, place, and time. She appears well-developed. No distress.  Obese   HENT:  Head: Normocephalic.  Mouth/Throat: Oropharynx is clear and moist.  Eyes: Conjunctivae and EOM are normal. Pupils are equal, round, and reactive to light. Right eye exhibits no discharge. Left eye exhibits no discharge. No scleral icterus.  Neck: Normal range of motion. No JVD present. No thyromegaly present.  Cardiovascular: Normal rate, regular rhythm, normal heart sounds and intact distal pulses.  Exam reveals no gallop and no friction rub.   No murmur heard. Pulmonary/Chest: Effort normal and breath sounds normal. No respiratory distress. She has no wheezes. She has no rales.  Abdominal: Soft. Bowel sounds are normal. She exhibits no distension and no mass. There is no tenderness. There is no rebound and no guarding.  Genitourinary:  Genitourinary Comments: Continent   Musculoskeletal: She exhibits no tenderness.  Moves x 4 extremities except Left knee limited ROM due to pain  Lymphadenopathy:    She has no cervical adenopathy.  Neurological: She is oriented to person, place, and time.  Skin: Skin is warm and dry. No rash noted. No erythema. No pallor.  Old bruise on thigh and posterior knee. Left knee surgical drsg dry, clean and intact without any signs of infections.   Psychiatric: She has a normal mood and affect.    Labs reviewed: Basic Metabolic Panel:  Recent Labs  16/10/96 0444 03/03/16 08/12/16 1626 08/25/16 0607  NA 139 136* 139 132*  K 4.4 5.1 4.1 4.5  CL 104  --  105 99*  CO2 25  --  26 25  GLUCOSE 130*  --  182* 155*  BUN 27* 28* 25* 17  CREATININE 1.14* 1.1 1.06* 0.93  CALCIUM 9.1  --  9.7 9.0    Liver  Function Tests:  Recent Labs  03/03/16  AST 25  ALT 24  ALKPHOS 62   CBC:  Recent Labs  08/12/16 1626 08/25/16 0607 08/26/16 0334  WBC 5.2 7.6 9.3  HGB 11.7* 10.4* 9.8*  HCT 35.6* 31.3* 29.4*  MCV 97.3 97.2 97.0  PLT 172 135* 145*     Recent Labs  08/26/16 0648 08/26/16 1220 08/26/16 1659  GLUCAP 117* 122* 112*   Assessment/Plan:   1. Essential (primary) hypertension B/p stable. Continue on Lisinopril 20 mg Tablet. BMP in 1-2 weeks with PCP   2. Controlled type 2 diabetes mellitus without complication, without long-term current use of insulin (HCC) CBG's 90's-140's. Continue ACE Inhibitor.On Statin. Continue on Metformin. Hgb A1C with PCP. Will need annual eye and Foot exam.   3. HLD (hyperlipidemia) Continue on Atorvastatin. Lipid panel with PCP   4. Hypothyroidism, unspecified hypothyroidism type Continue on Levothyroxine 200 mcg tablet. TSH level with PCP   5. Depression Stable. Continue escitalopram 20 mg Tablet.No mood changes.   6. Status post total left knee replacement Incision site without any signs of infections. Pain under control. Continue on Xarelto. D/c home with outpatient therapy. No DME required has own FWW.     Patient is being discharged with the following home health services:   None. outpatient therapy.  Patient is being discharged with the following durable medical equipment:   No DME required has own FWW.  Patient has been advised to f/u with their PCP in 1-2 weeks to bring them up to date on their rehab stay.  Social services at facility was responsible for arranging this appointment.  Pt was provided with a 30 day supply of prescriptions for medications and refills must be obtained from their PCP.  For controlled substances, a more limited supply may be provided adequate until PCP appointment only.  Future labs/tests needed:  CBC, BMP in 1-2 weeks with PCP

## 2016-09-03 DIAGNOSIS — M1712 Unilateral primary osteoarthritis, left knee: Secondary | ICD-10-CM | POA: Diagnosis not present

## 2016-09-03 DIAGNOSIS — M25662 Stiffness of left knee, not elsewhere classified: Secondary | ICD-10-CM | POA: Diagnosis not present

## 2016-09-03 DIAGNOSIS — R262 Difficulty in walking, not elsewhere classified: Secondary | ICD-10-CM | POA: Diagnosis not present

## 2016-09-03 DIAGNOSIS — M25562 Pain in left knee: Secondary | ICD-10-CM | POA: Diagnosis not present

## 2016-09-06 DIAGNOSIS — R262 Difficulty in walking, not elsewhere classified: Secondary | ICD-10-CM | POA: Diagnosis not present

## 2016-09-06 DIAGNOSIS — M25662 Stiffness of left knee, not elsewhere classified: Secondary | ICD-10-CM | POA: Diagnosis not present

## 2016-09-06 DIAGNOSIS — M1712 Unilateral primary osteoarthritis, left knee: Secondary | ICD-10-CM | POA: Diagnosis not present

## 2016-09-06 DIAGNOSIS — M25562 Pain in left knee: Secondary | ICD-10-CM | POA: Diagnosis not present

## 2016-09-08 DIAGNOSIS — M1712 Unilateral primary osteoarthritis, left knee: Secondary | ICD-10-CM | POA: Diagnosis not present

## 2016-09-08 DIAGNOSIS — M25562 Pain in left knee: Secondary | ICD-10-CM | POA: Diagnosis not present

## 2016-09-08 DIAGNOSIS — R262 Difficulty in walking, not elsewhere classified: Secondary | ICD-10-CM | POA: Diagnosis not present

## 2016-09-08 DIAGNOSIS — M25662 Stiffness of left knee, not elsewhere classified: Secondary | ICD-10-CM | POA: Diagnosis not present

## 2016-09-10 DIAGNOSIS — M25662 Stiffness of left knee, not elsewhere classified: Secondary | ICD-10-CM | POA: Diagnosis not present

## 2016-09-10 DIAGNOSIS — M25562 Pain in left knee: Secondary | ICD-10-CM | POA: Diagnosis not present

## 2016-09-10 DIAGNOSIS — R262 Difficulty in walking, not elsewhere classified: Secondary | ICD-10-CM | POA: Diagnosis not present

## 2016-09-10 DIAGNOSIS — M1712 Unilateral primary osteoarthritis, left knee: Secondary | ICD-10-CM | POA: Diagnosis not present

## 2016-09-13 DIAGNOSIS — M25562 Pain in left knee: Secondary | ICD-10-CM | POA: Diagnosis not present

## 2016-09-13 DIAGNOSIS — M1712 Unilateral primary osteoarthritis, left knee: Secondary | ICD-10-CM | POA: Diagnosis not present

## 2016-09-13 DIAGNOSIS — M25662 Stiffness of left knee, not elsewhere classified: Secondary | ICD-10-CM | POA: Diagnosis not present

## 2016-09-13 DIAGNOSIS — R262 Difficulty in walking, not elsewhere classified: Secondary | ICD-10-CM | POA: Diagnosis not present

## 2016-09-15 DIAGNOSIS — Z96659 Presence of unspecified artificial knee joint: Secondary | ICD-10-CM | POA: Diagnosis not present

## 2016-09-15 DIAGNOSIS — M170 Bilateral primary osteoarthritis of knee: Secondary | ICD-10-CM | POA: Diagnosis not present

## 2016-09-15 DIAGNOSIS — G4701 Insomnia due to medical condition: Secondary | ICD-10-CM | POA: Diagnosis not present

## 2016-09-15 DIAGNOSIS — M25562 Pain in left knee: Secondary | ICD-10-CM | POA: Diagnosis not present

## 2016-09-15 DIAGNOSIS — M25662 Stiffness of left knee, not elsewhere classified: Secondary | ICD-10-CM | POA: Diagnosis not present

## 2016-09-15 DIAGNOSIS — D509 Iron deficiency anemia, unspecified: Secondary | ICD-10-CM | POA: Diagnosis not present

## 2016-09-15 DIAGNOSIS — M1712 Unilateral primary osteoarthritis, left knee: Secondary | ICD-10-CM | POA: Diagnosis not present

## 2016-09-15 DIAGNOSIS — Z7901 Long term (current) use of anticoagulants: Secondary | ICD-10-CM | POA: Diagnosis not present

## 2016-09-15 DIAGNOSIS — R262 Difficulty in walking, not elsewhere classified: Secondary | ICD-10-CM | POA: Diagnosis not present

## 2016-09-17 DIAGNOSIS — M25662 Stiffness of left knee, not elsewhere classified: Secondary | ICD-10-CM | POA: Diagnosis not present

## 2016-09-17 DIAGNOSIS — R262 Difficulty in walking, not elsewhere classified: Secondary | ICD-10-CM | POA: Diagnosis not present

## 2016-09-17 DIAGNOSIS — M1712 Unilateral primary osteoarthritis, left knee: Secondary | ICD-10-CM | POA: Diagnosis not present

## 2016-09-17 DIAGNOSIS — M25562 Pain in left knee: Secondary | ICD-10-CM | POA: Diagnosis not present

## 2016-09-20 DIAGNOSIS — B349 Viral infection, unspecified: Secondary | ICD-10-CM | POA: Diagnosis not present

## 2016-09-20 DIAGNOSIS — J029 Acute pharyngitis, unspecified: Secondary | ICD-10-CM | POA: Diagnosis not present

## 2016-09-22 ENCOUNTER — Encounter (HOSPITAL_COMMUNITY): Payer: Self-pay | Admitting: Emergency Medicine

## 2016-09-22 ENCOUNTER — Inpatient Hospital Stay (HOSPITAL_COMMUNITY)
Admission: EM | Admit: 2016-09-22 | Discharge: 2016-09-24 | DRG: 872 | Disposition: A | Discharge status: Home Health | Payer: Medicare Other | Attending: Family Medicine | Admitting: Family Medicine

## 2016-09-22 ENCOUNTER — Emergency Department (HOSPITAL_COMMUNITY): Payer: Medicare Other

## 2016-09-22 DIAGNOSIS — N179 Acute kidney failure, unspecified: Secondary | ICD-10-CM | POA: Diagnosis not present

## 2016-09-22 DIAGNOSIS — A4151 Sepsis due to Escherichia coli [E. coli]: Secondary | ICD-10-CM | POA: Diagnosis not present

## 2016-09-22 DIAGNOSIS — E860 Dehydration: Secondary | ICD-10-CM | POA: Diagnosis present

## 2016-09-22 DIAGNOSIS — R7881 Bacteremia: Secondary | ICD-10-CM | POA: Diagnosis not present

## 2016-09-22 DIAGNOSIS — E7800 Pure hypercholesterolemia, unspecified: Secondary | ICD-10-CM | POA: Diagnosis present

## 2016-09-22 DIAGNOSIS — E871 Hypo-osmolality and hyponatremia: Secondary | ICD-10-CM | POA: Diagnosis not present

## 2016-09-22 DIAGNOSIS — E039 Hypothyroidism, unspecified: Secondary | ICD-10-CM | POA: Diagnosis not present

## 2016-09-22 DIAGNOSIS — Z96653 Presence of artificial knee joint, bilateral: Secondary | ICD-10-CM | POA: Diagnosis present

## 2016-09-22 DIAGNOSIS — R509 Fever, unspecified: Secondary | ICD-10-CM | POA: Diagnosis not present

## 2016-09-22 DIAGNOSIS — E785 Hyperlipidemia, unspecified: Secondary | ICD-10-CM | POA: Diagnosis not present

## 2016-09-22 DIAGNOSIS — R404 Transient alteration of awareness: Secondary | ICD-10-CM | POA: Diagnosis not present

## 2016-09-22 DIAGNOSIS — F329 Major depressive disorder, single episode, unspecified: Secondary | ICD-10-CM | POA: Diagnosis present

## 2016-09-22 DIAGNOSIS — I10 Essential (primary) hypertension: Secondary | ICD-10-CM | POA: Diagnosis not present

## 2016-09-22 DIAGNOSIS — Z7409 Other reduced mobility: Secondary | ICD-10-CM | POA: Diagnosis not present

## 2016-09-22 DIAGNOSIS — Z91040 Latex allergy status: Secondary | ICD-10-CM | POA: Diagnosis not present

## 2016-09-22 DIAGNOSIS — R531 Weakness: Secondary | ICD-10-CM | POA: Diagnosis not present

## 2016-09-22 DIAGNOSIS — E119 Type 2 diabetes mellitus without complications: Secondary | ICD-10-CM | POA: Diagnosis not present

## 2016-09-22 DIAGNOSIS — I890 Lymphedema, not elsewhere classified: Secondary | ICD-10-CM | POA: Diagnosis present

## 2016-09-22 DIAGNOSIS — N390 Urinary tract infection, site not specified: Secondary | ICD-10-CM | POA: Diagnosis not present

## 2016-09-22 DIAGNOSIS — Z79899 Other long term (current) drug therapy: Secondary | ICD-10-CM | POA: Diagnosis not present

## 2016-09-22 DIAGNOSIS — Z7901 Long term (current) use of anticoagulants: Secondary | ICD-10-CM | POA: Diagnosis not present

## 2016-09-22 DIAGNOSIS — F419 Anxiety disorder, unspecified: Secondary | ICD-10-CM | POA: Diagnosis present

## 2016-09-22 DIAGNOSIS — Z7984 Long term (current) use of oral hypoglycemic drugs: Secondary | ICD-10-CM | POA: Diagnosis not present

## 2016-09-22 DIAGNOSIS — Z888 Allergy status to other drugs, medicaments and biological substances status: Secondary | ICD-10-CM | POA: Diagnosis not present

## 2016-09-22 DIAGNOSIS — Z6841 Body Mass Index (BMI) 40.0 and over, adult: Secondary | ICD-10-CM | POA: Diagnosis not present

## 2016-09-22 DIAGNOSIS — A419 Sepsis, unspecified organism: Secondary | ICD-10-CM | POA: Diagnosis not present

## 2016-09-22 DIAGNOSIS — I959 Hypotension, unspecified: Secondary | ICD-10-CM | POA: Diagnosis not present

## 2016-09-22 DIAGNOSIS — D649 Anemia, unspecified: Secondary | ICD-10-CM | POA: Diagnosis not present

## 2016-09-22 DIAGNOSIS — W1830XA Fall on same level, unspecified, initial encounter: Secondary | ICD-10-CM | POA: Diagnosis present

## 2016-09-22 DIAGNOSIS — F32A Depression, unspecified: Secondary | ICD-10-CM | POA: Diagnosis present

## 2016-09-22 DIAGNOSIS — I9589 Other hypotension: Secondary | ICD-10-CM | POA: Diagnosis not present

## 2016-09-22 HISTORY — DX: Lymphedema, not elsewhere classified: I89.0

## 2016-09-22 LAB — IRON AND TIBC
IRON: 14 ug/dL — AB (ref 28–170)
SATURATION RATIOS: 6 % — AB (ref 10.4–31.8)
TIBC: 231 ug/dL — AB (ref 250–450)
UIBC: 217 ug/dL

## 2016-09-22 LAB — CBC WITH DIFFERENTIAL/PLATELET
BASOS PCT: 0 %
Basophils Absolute: 0 10*3/uL (ref 0.0–0.1)
Basophils Absolute: 0 10*3/uL (ref 0.0–0.1)
Basophils Relative: 0 %
EOS ABS: 0 10*3/uL (ref 0.0–0.7)
EOS PCT: 0 %
Eosinophils Absolute: 0 10*3/uL (ref 0.0–0.7)
Eosinophils Relative: 0 %
HCT: 14.6 % — ABNORMAL LOW (ref 36.0–46.0)
HEMATOCRIT: 23.2 % — AB (ref 36.0–46.0)
HEMOGLOBIN: 4.7 g/dL — AB (ref 12.0–15.0)
Hemoglobin: 7.7 g/dL — ABNORMAL LOW (ref 12.0–15.0)
LYMPHS ABS: 0.7 10*3/uL (ref 0.7–4.0)
LYMPHS PCT: 5 %
Lymphocytes Relative: 5 %
Lymphs Abs: 0.4 10*3/uL — ABNORMAL LOW (ref 0.7–4.0)
MCH: 31.3 pg (ref 26.0–34.0)
MCH: 32.2 pg (ref 26.0–34.0)
MCHC: 32.2 g/dL (ref 30.0–36.0)
MCHC: 33.2 g/dL (ref 30.0–36.0)
MCV: 97.3 fL (ref 78.0–100.0)
MCV: 97.1 fL (ref 78.0–100.0)
MONO ABS: 3 10*3/uL — AB (ref 0.1–1.0)
MONOS PCT: 20 %
Monocytes Absolute: 1.6 10*3/uL — ABNORMAL HIGH (ref 0.1–1.0)
Monocytes Relative: 23 %
NEUTROS ABS: 6 10*3/uL (ref 1.7–7.7)
NEUTROS PCT: 75 %
Neutro Abs: 9.6 10*3/uL — ABNORMAL HIGH (ref 1.7–7.7)
Neutrophils Relative %: 72 %
PLATELETS: 140 10*3/uL — AB (ref 150–400)
Platelets: 84 10*3/uL — ABNORMAL LOW (ref 150–400)
RBC: 1.5 MIL/uL — AB (ref 3.87–5.11)
RBC: 2.39 MIL/uL — ABNORMAL LOW (ref 3.87–5.11)
RDW: 14 % (ref 11.5–15.5)
RDW: 14 % (ref 11.5–15.5)
WBC: 8 10*3/uL (ref 4.0–10.5)
WBC: 13.3 10*3/uL — ABNORMAL HIGH (ref 4.0–10.5)

## 2016-09-22 LAB — CBG MONITORING, ED
GLUCOSE-CAPILLARY: 116 mg/dL — AB (ref 65–99)
GLUCOSE-CAPILLARY: 128 mg/dL — AB (ref 65–99)

## 2016-09-22 LAB — COMPREHENSIVE METABOLIC PANEL
ALBUMIN: 2.2 g/dL — AB (ref 3.5–5.0)
ALT: 22 U/L (ref 14–54)
AST: 48 U/L — AB (ref 15–41)
Alkaline Phosphatase: 71 U/L (ref 38–126)
Anion gap: 5 (ref 5–15)
BUN: 29 mg/dL — AB (ref 6–20)
CHLORIDE: 100 mmol/L — AB (ref 101–111)
CO2: 25 mmol/L (ref 22–32)
Calcium: 7.9 mg/dL — ABNORMAL LOW (ref 8.9–10.3)
Creatinine, Ser: 1.52 mg/dL — ABNORMAL HIGH (ref 0.44–1.00)
GFR calc Af Amer: 39 mL/min — ABNORMAL LOW (ref >60)
GFR calc non Af Amer: 34 mL/min — ABNORMAL LOW (ref >60)
GLUCOSE: 138 mg/dL — AB (ref 65–99)
POTASSIUM: 4.4 mmol/L (ref 3.5–5.1)
SODIUM: 130 mmol/L — AB (ref 135–145)
Total Bilirubin: 1.4 mg/dL — ABNORMAL HIGH (ref 0.3–1.2)
Total Protein: 4.2 g/dL — ABNORMAL LOW (ref 6.5–8.1)

## 2016-09-22 LAB — BLOOD CULTURE ID PANEL (REFLEXED)
ACINETOBACTER BAUMANNII: NOT DETECTED
CANDIDA ALBICANS: NOT DETECTED
CANDIDA GLABRATA: NOT DETECTED
CANDIDA KRUSEI: NOT DETECTED
CANDIDA PARAPSILOSIS: NOT DETECTED
CANDIDA TROPICALIS: NOT DETECTED
Carbapenem resistance: NOT DETECTED
ENTEROBACTERIACEAE SPECIES: DETECTED — AB
ENTEROCOCCUS SPECIES: NOT DETECTED
Enterobacter cloacae complex: NOT DETECTED
Escherichia coli: DETECTED — AB
Haemophilus influenzae: NOT DETECTED
Klebsiella oxytoca: NOT DETECTED
Klebsiella pneumoniae: NOT DETECTED
Listeria monocytogenes: NOT DETECTED
Neisseria meningitidis: NOT DETECTED
Proteus species: NOT DETECTED
Pseudomonas aeruginosa: NOT DETECTED
STAPHYLOCOCCUS SPECIES: NOT DETECTED
STREPTOCOCCUS AGALACTIAE: NOT DETECTED
Serratia marcescens: NOT DETECTED
Staphylococcus aureus (BCID): NOT DETECTED
Streptococcus pneumoniae: NOT DETECTED
Streptococcus pyogenes: NOT DETECTED
Streptococcus species: NOT DETECTED

## 2016-09-22 LAB — VITAMIN B12: VITAMIN B 12: 2118 pg/mL — AB (ref 180–914)

## 2016-09-22 LAB — FOLATE: Folate: 35.2 ng/mL (ref >5.9)

## 2016-09-22 LAB — GLUCOSE, CAPILLARY
GLUCOSE-CAPILLARY: 136 mg/dL — AB (ref 65–99)
GLUCOSE-CAPILLARY: 103 mg/dL — AB (ref 65–99)
GLUCOSE-CAPILLARY: 119 mg/dL — AB (ref 65–99)

## 2016-09-22 LAB — URINALYSIS, ROUTINE W REFLEX MICROSCOPIC
Bilirubin Urine: NEGATIVE
GLUCOSE, UA: NEGATIVE mg/dL
Ketones, ur: NEGATIVE mg/dL
Nitrite: POSITIVE — AB
PROTEIN: NEGATIVE mg/dL
Specific Gravity, Urine: 1.011 (ref 1.005–1.030)
pH: 6 (ref 5.0–8.0)

## 2016-09-22 LAB — CREATININE, URINE, RANDOM: CREATININE, URINE: 56.08 mg/dL

## 2016-09-22 LAB — APTT: APTT: 52 s — AB (ref 24–36)

## 2016-09-22 LAB — URINE MICROSCOPIC-ADD ON

## 2016-09-22 LAB — I-STAT CG4 LACTIC ACID, ED
LACTIC ACID, VENOUS: 0.53 mmol/L (ref 0.5–1.9)
Lactic Acid, Venous: 1.42 mmol/L (ref 0.5–1.9)
Lactic Acid, Venous: 1.47 mmol/L (ref 0.5–1.9)

## 2016-09-22 LAB — PROCALCITONIN: PROCALCITONIN: 6.82 ng/mL

## 2016-09-22 LAB — PROTIME-INR
INR: 2.18
Prothrombin Time: 24.6 seconds — ABNORMAL HIGH (ref 11.4–15.2)

## 2016-09-22 LAB — SODIUM, URINE, RANDOM

## 2016-09-22 LAB — MRSA PCR SCREENING: MRSA by PCR: NEGATIVE

## 2016-09-22 LAB — RETICULOCYTES
RBC.: 3.12 MIL/uL — ABNORMAL LOW (ref 3.87–5.11)
Retic Count, Absolute: 40.6 10*3/uL (ref 19.0–186.0)
Retic Ct Pct: 1.3 % (ref 0.4–3.1)

## 2016-09-22 LAB — RAPID STREP SCREEN (MED CTR MEBANE ONLY): Streptococcus, Group A Screen (Direct): NEGATIVE

## 2016-09-22 LAB — HEMOGLOBIN AND HEMATOCRIT, BLOOD
HEMATOCRIT: 30.2 % — AB (ref 36.0–46.0)
HEMOGLOBIN: 9.6 g/dL — AB (ref 12.0–15.0)

## 2016-09-22 LAB — FERRITIN: Ferritin: 170 ng/mL (ref 11–307)

## 2016-09-22 LAB — PREPARE RBC (CROSSMATCH)

## 2016-09-22 MED ORDER — VITAMIN D 1000 UNITS PO TABS
1000.0000 [IU] | ORAL_TABLET | Freq: Every day | ORAL | Status: DC
  Administered 2016-09-22 – 2016-09-24 (×3): 1000 [IU] via ORAL
  Filled 2016-09-22 (×3): qty 1

## 2016-09-22 MED ORDER — VANCOMYCIN HCL 10 G IV SOLR
2000.0000 mg | Freq: Once | INTRAVENOUS | Status: AC
  Administered 2016-09-22: 2000 mg via INTRAVENOUS
  Filled 2016-09-22: qty 2000

## 2016-09-22 MED ORDER — OXYCODONE HCL 5 MG PO TABS: 5.0000 mg | ORAL_TABLET | Freq: Four times a day (QID) | ORAL | Status: DC | PRN

## 2016-09-22 MED ORDER — SODIUM CHLORIDE 0.9 % IV BOLUS (SEPSIS)
1000.0000 mL | Freq: Once | INTRAVENOUS | Status: AC
  Administered 2016-09-22: 1000 mL via INTRAVENOUS

## 2016-09-22 MED ORDER — PIPERACILLIN-TAZOBACTAM 3.375 G IVPB
3.3750 g | Freq: Three times a day (TID) | INTRAVENOUS | Status: DC
  Administered 2016-09-22 – 2016-09-23 (×5): 3.375 g via INTRAVENOUS
  Filled 2016-09-22 (×10): qty 50

## 2016-09-22 MED ORDER — SODIUM CHLORIDE 0.9 % IV SOLN
INTRAVENOUS | Status: DC
  Administered 2016-09-22 – 2016-09-24 (×5): via INTRAVENOUS

## 2016-09-22 MED ORDER — OXYCODONE-ACETAMINOPHEN 5-325 MG PO TABS: 1.0000 | ORAL_TABLET | Freq: Four times a day (QID) | ORAL | Status: DC | PRN

## 2016-09-22 MED ORDER — GABAPENTIN 400 MG PO CAPS
400.0000 mg | ORAL_CAPSULE | Freq: Two times a day (BID) | ORAL | Status: DC
  Administered 2016-09-22 – 2016-09-24 (×5): 400 mg via ORAL
  Filled 2016-09-22 (×5): qty 1

## 2016-09-22 MED ORDER — SODIUM CHLORIDE 0.9% FLUSH
3.0000 mL | Freq: Two times a day (BID) | INTRAVENOUS | Status: DC
  Administered 2016-09-22 – 2016-09-23 (×3): 3 mL via INTRAVENOUS

## 2016-09-22 MED ORDER — PIPERACILLIN-TAZOBACTAM 3.375 G IVPB 30 MIN: 3.3750 g | Freq: Three times a day (TID) | INTRAVENOUS | Status: DC

## 2016-09-22 MED ORDER — ENOXAPARIN SODIUM 40 MG/0.4ML ~~LOC~~ SOLN
40.0000 mg | Freq: Every day | SUBCUTANEOUS | Status: DC
  Administered 2016-09-22 – 2016-09-24 (×3): 40 mg via SUBCUTANEOUS
  Filled 2016-09-22 (×3): qty 0.4

## 2016-09-22 MED ORDER — VALACYCLOVIR HCL 500 MG PO TABS
500.0000 mg | ORAL_TABLET | Freq: Every day | ORAL | Status: DC
  Administered 2016-09-22 – 2016-09-24 (×3): 500 mg via ORAL
  Filled 2016-09-22 (×4): qty 1

## 2016-09-22 MED ORDER — INSULIN ASPART 100 UNIT/ML ~~LOC~~ SOLN: 0.0000 [IU] | Freq: Every day | SUBCUTANEOUS | Status: DC

## 2016-09-22 MED ORDER — SODIUM CHLORIDE 0.9 % IV SOLN: 10.0000 mL/h | Freq: Once | INTRAVENOUS | Status: DC

## 2016-09-22 MED ORDER — BACLOFEN 10 MG PO TABS: 10.0000 mg | ORAL_TABLET | Freq: Three times a day (TID) | ORAL | Status: DC | PRN

## 2016-09-22 MED ORDER — LEVOTHYROXINE SODIUM 100 MCG PO TABS
200.0000 ug | ORAL_TABLET | Freq: Every day | ORAL | Status: DC
  Administered 2016-09-22 – 2016-09-24 (×3): 200 ug via ORAL
  Filled 2016-09-22: qty 1
  Filled 2016-09-22 (×2): qty 2

## 2016-09-22 MED ORDER — ESCITALOPRAM OXALATE 20 MG PO TABS
20.0000 mg | ORAL_TABLET | Freq: Every day | ORAL | Status: DC
  Administered 2016-09-22 – 2016-09-23 (×2): 20 mg via ORAL
  Filled 2016-09-22: qty 1
  Filled 2016-09-22: qty 2

## 2016-09-22 MED ORDER — ATORVASTATIN CALCIUM 10 MG PO TABS
10.0000 mg | ORAL_TABLET | Freq: Every day | ORAL | Status: DC
  Administered 2016-09-22 – 2016-09-24 (×3): 10 mg via ORAL
  Filled 2016-09-22 (×3): qty 1

## 2016-09-22 MED ORDER — ACETAMINOPHEN 325 MG PO TABS
650.0000 mg | ORAL_TABLET | Freq: Four times a day (QID) | ORAL | Status: DC | PRN
  Administered 2016-09-22 – 2016-09-23 (×3): 650 mg via ORAL
  Filled 2016-09-22 (×3): qty 2

## 2016-09-22 MED ORDER — INSULIN ASPART 100 UNIT/ML ~~LOC~~ SOLN
0.0000 [IU] | Freq: Three times a day (TID) | SUBCUTANEOUS | Status: DC
  Administered 2016-09-22 – 2016-09-24 (×4): 1 [IU] via SUBCUTANEOUS
  Filled 2016-09-22: qty 1

## 2016-09-22 MED ORDER — ADULT MULTIVITAMIN W/MINERALS CH
1.0000 | ORAL_TABLET | Freq: Every day | ORAL | Status: DC
  Administered 2016-09-22 – 2016-09-23 (×2): 1 via ORAL
  Filled 2016-09-22 (×3): qty 1

## 2016-09-22 MED ORDER — ACETAMINOPHEN 650 MG RE SUPP: 650.0000 mg | Freq: Four times a day (QID) | RECTAL | Status: DC | PRN

## 2016-09-22 MED ORDER — VANCOMYCIN HCL IN DEXTROSE 1-5 GM/200ML-% IV SOLN: 1000.0000 mg | Freq: Once | INTRAVENOUS | Status: DC

## 2016-09-22 MED ORDER — SENNOSIDES-DOCUSATE SODIUM 8.6-50 MG PO TABS
2.0000 | ORAL_TABLET | Freq: Every day | ORAL | Status: DC
  Administered 2016-09-22 – 2016-09-24 (×3): 2 via ORAL
  Filled 2016-09-22 (×3): qty 2

## 2016-09-22 MED ORDER — OXYBUTYNIN CHLORIDE ER 15 MG PO TB24
15.0000 mg | ORAL_TABLET | Freq: Every morning | ORAL | Status: DC
  Administered 2016-09-22 – 2016-09-24 (×3): 15 mg via ORAL
  Filled 2016-09-22 (×3): qty 1

## 2016-09-22 MED ORDER — ONDANSETRON HCL 4 MG PO TABS: 4.0000 mg | ORAL_TABLET | Freq: Three times a day (TID) | ORAL | Status: DC | PRN

## 2016-09-22 MED ORDER — OXYCODONE-ACETAMINOPHEN 10-325 MG PO TABS: 1.0000 | ORAL_TABLET | Freq: Four times a day (QID) | ORAL | Status: DC | PRN

## 2016-09-22 MED ORDER — PIPERACILLIN-TAZOBACTAM 3.375 G IVPB 30 MIN
3.3750 g | Freq: Once | INTRAVENOUS | Status: AC
  Administered 2016-09-22: 3.375 g via INTRAVENOUS
  Filled 2016-09-22: qty 50

## 2016-09-22 NOTE — ED Notes (Signed)
Admitting MD paged regarding possible downgrade of pt to tele.  MD requests repeat blood counts prior to making decision.  Order entered.

## 2016-09-22 NOTE — ED Triage Notes (Addendum)
Per [facility]   Pt has been feeling under the weather for 2 week. Seen by her PCP on Monday for sore throat. Not strept but was prescribed abts. Fever and chills since saturday and had a fever Monday. Pt fell denies injury form fall. Stood up walking and legs got weak and she sat down. C/o malaise and weakness. Strong foul odor in the house. Pt was changed and was intially found in a soaked brief.

## 2016-09-22 NOTE — ED Notes (Signed)
Lunch tray ordered for pt.

## 2016-09-22 NOTE — Progress Notes (Signed)
PHARMACY - PHYSICIAN COMMUNICATION CRITICAL VALUE ALERT - BLOOD CULTURE IDENTIFICATION (BCID)  Results for orders placed or performed during the hospital encounter of 09/22/16  Blood Culture ID Panel (Reflexed) (Collected: 09/22/2016  3:29 AM)  Result Value Ref Range   Enterococcus species NOT DETECTED NOT DETECTED   Listeria monocytogenes NOT DETECTED NOT DETECTED   Staphylococcus species NOT DETECTED NOT DETECTED   Staphylococcus aureus NOT DETECTED NOT DETECTED   Streptococcus species NOT DETECTED NOT DETECTED   Streptococcus agalactiae NOT DETECTED NOT DETECTED   Streptococcus pneumoniae NOT DETECTED NOT DETECTED   Streptococcus pyogenes NOT DETECTED NOT DETECTED   Acinetobacter baumannii NOT DETECTED NOT DETECTED   Enterobacteriaceae species DETECTED (A) NOT DETECTED   Enterobacter cloacae complex NOT DETECTED NOT DETECTED   Escherichia coli DETECTED (A) NOT DETECTED   Klebsiella oxytoca NOT DETECTED NOT DETECTED   Klebsiella pneumoniae NOT DETECTED NOT DETECTED   Proteus species NOT DETECTED NOT DETECTED   Serratia marcescens NOT DETECTED NOT DETECTED   Carbapenem resistance NOT DETECTED NOT DETECTED   Haemophilus influenzae NOT DETECTED NOT DETECTED   Neisseria meningitidis NOT DETECTED NOT DETECTED   Pseudomonas aeruginosa NOT DETECTED NOT DETECTED   Candida albicans NOT DETECTED NOT DETECTED   Candida glabrata NOT DETECTED NOT DETECTED   Candida krusei NOT DETECTED NOT DETECTED   Candida parapsilosis NOT DETECTED NOT DETECTED   Candida tropicalis NOT DETECTED NOT DETECTED    Name of physician (or Provider) Contacted: K. Schorr  Changes to prescribed antibiotics required: n/a- already on Zosyn.   Kerra Guilfoil D. Alexzandra Bilton, PharmD, BCPS Clinical Pharmacist Pager: [phone] 09/22/2016 8:59 PM

## 2016-09-22 NOTE — Progress Notes (Signed)
Pharmacy Antibiotic Note  Kathy PaisCandace E Nunziata is a 70 y.o. female admitted on 09/22/2016 with sepsis.  Pharmacy has been consulted for Zosyn dosing. Estimated normalized CrCl ~40 ml/min  Pt received 2gm IV Vanc and Zosyn 3.375gm in ED ~0300  Plan: Zosyn 3.375gm IV q8h - doses over 4 hours Will f/u micro data, renal function, and pt's clinical condition   Height: 5\' 9"  (175.3 cm) Weight: 292 lb (132.5 kg) IBW/kg (Calculated) : 66.2  Temp (24hrs), Avg:99 F (37.2 C), Min:99 F (37.2 C), Max:99 F (37.2 C)   Recent Labs Lab 09/22/16 0301 09/22/16 0311 09/22/16 0339 09/22/16 0340  WBC 8.0  --   --  13.3*  CREATININE  --   --  1.52*  --   LATICACIDVEN  --  0.53  --   --     Estimated Creatinine Clearance: 51.1 mL/min (by C-G formula based on SCr of 1.52 mg/dL (H)).    Allergies  Allergen Reactions  . Dilantin [Phenytoin Sodium Extended] Hives and Itching  . Tegretol [Carbamazepine] Hives and Itching  . Adhesive [Tape] Rash  . Latex Itching and Rash    Antimicrobials this admission: 10/4 Vanc x1 10/4 Zosyn >>   Dose adjustments this admission: n/a  Microbiology results: 10/4 BCx x2:  10/4 UCx:    Thank you for allowing pharmacy to be a part of this patient's care.  Christoper Fabianaron Juliauna Stueve, PharmD, BCPS Clinical pharmacist, pager [phone] 09/22/2016 6:07 AM

## 2016-09-22 NOTE — ED Notes (Signed)
Meal tray ordered for pt  

## 2016-09-22 NOTE — ED Notes (Signed)
Pt brother is leaving, number is in chart, he wants his cell number called multiple times before trying the house number. He will be returning in the afternoon.

## 2016-09-22 NOTE — ED Notes (Signed)
RN and EMT attempting to get urine sample. Pt continues to fall asleep while on bedpan.

## 2016-09-22 NOTE — ED Notes (Signed)
Pt's CBG is 116.  Informed Wilmer FloorErin Ha, RN.

## 2016-09-22 NOTE — ED Provider Notes (Signed)
[facility]-EMERGENCY DEPT Provider Note   CSN: [id number] Arrival date & time: 09/22/16  0240  By signing my name below, I, Emmanuella Mensah, attest that this documentation has been prepared under the direction and in the presence of Dione Booze, MD. Electronically Signed: Angelene Giovanni, ED Scribe. 09/22/16. 3:12 AM.   History   Chief Complaint Chief Complaint  Patient presents with  . Code Sepsis  . Fever    HPI Comments: Kathy Young is a 70 y.o. female with a hx of DM II, HLD, hypertension, and total knee athroplasty brought in by ambulance, who presents to the Emergency Department for evaluation s/p fall that occurred at 11:30 pm yesterday. Pt explains that she has been experiencing generalized weakness beginning 4 days ago and was seen by her PCP 3 days ago because she had sore throat and one episode of non-bloody vomiting with her weakness. She adds that she was discharged with antibiotics by her PCP and although she has been compliant with them, she is not sure of the name. She states that she is here today because her generalized weakness is progressing and at 11:30 pm yesterday, she stood up to ambulate but fell as she was too weak to ambulate. She denies any LOC or any injuries during the fall. She reports associated intermittent fever (highest 100.9 PTA), chills, and urinary urgency for the past several days. No alleviating factors noted. Pt has not tried any medications PTA. She reports that she had total left knee arthroplasty on 08/24/16 and is currently on anti-coagulants. She denies any pain, diaphoresis, cough, nausea, diarrhea, or constipation.  PCP: Dr. Jillyn Hidden  The history is provided by the patient. No language interpreter was used.    Past Medical History:  Diagnosis Date  . Anemia   . Anxiety   . Arthritis   . Complication of anesthesia    Hypoventalation and de sats after shoulder surgery- has sleep apnea, but can not use CPAP due to Trigeminal Neuralgia  .  Depression   . Diabetes mellitus without complication (HCC)    type II  . Hypercholesteremia   . Hypertension   . Hypothyroidism   . Lymphedema    legs, bilateral - wears support hose  . Noncompliance with CPAP treatment   . Obesity   . OSA (obstructive sleep apnea)   . Pneumonia    infant  . Primary localized osteoarthritis of left knee 08/24/2016  . Primary localized osteoarthritis of right knee 02/24/2016  . Reflux   . Seizures (HCC)    not for 20 years. does not see a neurologist 08/12/16- not for 30 years  . Severe obesity (BMI >= 40) (HCC) 02/24/2016  . SOB (shortness of breath)   . Trigeminal neuralgia   . Trigeminal neuralgia pain   . Urinary incontinence   . Vitamin D deficiency     Patient Active Problem List   Diagnosis Date Noted  . Depression 09/01/2016  . Primary localized osteoarthritis of left knee 08/24/2016  . S/P total knee arthroplasty 08/24/2016  . Peripheral nerve disease (HCC) 03/09/2016  . Controlled type 2 diabetes mellitus without complication (HCC) 03/09/2016  . Avitaminosis D 03/09/2016  . Adult hypothyroidism 03/09/2016  . HLD (hyperlipidemia) 03/09/2016  . Right knee pain 03/09/2016  . Primary localized osteoarthritis of right knee 02/24/2016  . Severe obesity (BMI >= 40) (HCC) 02/24/2016  . Essential (primary) hypertension 05/01/2015  . Body mass index (BMI) of 50-59.9 in adult (HCC) 05/01/2015  . Acquired hypothyroidism 05/01/2015  .  Primary osteoarthritis of both knees 05/01/2015    Past Surgical History:  Procedure Laterality Date  . APPENDECTOMY    . CARPAL TUNNEL RELEASE    . CHOLECYSTECTOMY    . COLONOSCOPY    . OVARIAN CYST REMOVAL    . SHOULDER SURGERY    . TONSILLECTOMY    . TOTAL KNEE ARTHROPLASTY Right 02/24/2016   Procedure: TOTAL KNEE ARTHROPLASTY;  Surgeon: Teryl Lucy, MD;  Location: [facility] OR;  Service: Orthopedics;  Laterality: Right;  . TOTAL KNEE ARTHROPLASTY Left 08/24/2016   Procedure: TOTAL KNEE ARTHROPLASTY;  Surgeon:  Teryl Lucy, MD;  Location: [facility] OR;  Service: Orthopedics;  Laterality: Left;  . TUBAL LIGATION      OB History    No data available       Home Medications    Prior to Admission medications   Medication Sig Start Date End Date Taking? Authorizing Provider  atorvastatin (LIPITOR) 10 MG tablet Take 10 mg by mouth daily at 6 PM. For hypercholesteremia    Historical Provider, MD  baclofen (LIORESAL) 10 MG tablet Take 1 tablet (10 mg total) by mouth 3 (three) times daily. As needed for muscle spasm 08/24/16   Teryl Lucy, MD  cholecalciferol (VITAMIN D) 1000 UNITS tablet Take 1,000 Units by mouth daily.    Historical Provider, MD  diclofenac sodium (VOLTAREN) 1 % GEL Apply 2 g topically 2 (two) times daily as needed (for pain).     Historical Provider, MD  escitalopram (LEXAPRO) 20 MG tablet Take 20 mg by mouth at bedtime. For depression    Historical Provider, MD  gabapentin (NEURONTIN) 400 MG capsule Take 400 mg by mouth 2 (two) times daily.    Historical Provider, MD  levothyroxine (SYNTHROID, LEVOTHROID) 200 MCG tablet Take 200 mcg by mouth daily before breakfast. **brand name only* For hypothyroidism    Historical Provider, MD  lisinopril (PRINIVIL,ZESTRIL) 20 MG tablet Take 20 mg by mouth daily. For HTN    Historical Provider, MD  metFORMIN (GLUCOPHAGE-XR) 500 MG 24 hr tablet Take 500 mg by mouth 2 (two) times daily with a meal. For diabetes 01/16/16   Historical Provider, MD  Multiple Vitamin (MULTIVITAMIN) tablet Take 1 tablet by mouth daily.    Historical Provider, MD  ondansetron (ZOFRAN) 4 MG tablet Take 1 tablet (4 mg total) by mouth every 8 (eight) hours as needed for nausea or vomiting. 08/24/16   Teryl Lucy, MD  oxybutynin (DITROPAN XL) 15 MG 24 hr tablet Take 15 mg by mouth every morning. For urinary incontinence    Historical Provider, MD  oxyCODONE-acetaminophen (PERCOCET) 10-325 MG tablet Take 1-2 tablets by mouth every 6 (six) hours as needed for pain. MAXIMUM TOTAL  ACETAMINOPHEN DOSE IS 4000 MG PER DAY 08/24/16   Teryl Lucy, MD  rivaroxaban (XARELTO) 10 MG TABS tablet Take 1 tablet (10 mg total) by mouth daily. 08/24/16   Teryl Lucy, MD  sennosides-docusate sodium (SENOKOT-S) 8.6-50 MG tablet Take 2 tablets by mouth daily. 08/24/16   Teryl Lucy, MD  traMADol (ULTRAM) 50 MG tablet Take 50 mg by mouth every 6 (six) hours as needed (for pain).     Historical Provider, MD  valACYclovir (VALTREX) 500 MG tablet Take 500 mg by mouth daily. 07/28/15   Historical Provider, MD    Family History Family History  Problem Relation Age of Onset  . Cancer Mother   . Hypertension Mother   . Diabetes Father   . Heart disease Father   . Heart attack Father   .  Diabetes Sister   . Diabetes Brother     Social History Social History  Substance Use Topics  . Smoking status: Never Smoker  . Smokeless tobacco: Never Used  . Alcohol use No     Allergies   Dilantin [phenytoin sodium extended]; Tegretol [carbamazepine]; Adhesive [tape]; and Latex   Review of Systems Review of Systems  Constitutional: Positive for chills and fever.  HENT: Positive for sore throat.   Gastrointestinal: Positive for vomiting. Negative for constipation, diarrhea and nausea.  Genitourinary: Positive for frequency.  Musculoskeletal: Negative for arthralgias and myalgias.  Neurological: Positive for weakness.  All other systems reviewed and are negative.    Physical Exam Updated Vital Signs BP (!) 78/44 (BP Location: Left Arm)   Pulse 84   Temp 99 F (37.2 C) (Oral)   Resp 18   SpO2 96%   Physical Exam  Constitutional: She is oriented to person, place, and time. She appears well-developed and well-nourished.  Obese  HENT:  Head: Normocephalic and atraumatic.  Eyes: EOM are normal. Pupils are equal, round, and reactive to light.  Neck: Normal range of motion. Neck supple. No JVD present.  Cardiovascular: Normal rate, regular rhythm and normal heart sounds.   No murmur  heard. Pulmonary/Chest: Effort normal and breath sounds normal. She has no wheezes. She has no rales. She exhibits no tenderness.  Abdominal: Soft. Bowel sounds are normal. She exhibits no distension and no mass. There is no tenderness.  Musculoskeletal: Normal range of motion. She exhibits no edema.  Lymphadenopathy:    She has no cervical adenopathy.  Neurological: She is alert and oriented to person, place, and time. No cranial nerve deficit. She exhibits normal muscle tone. Coordination normal.  2+ pre-tibial and pedal edema  Skin: Skin is warm and dry. No rash noted.  Superficial scratch, right buttocks without evidence of infection  Psychiatric: She has a normal mood and affect. Her behavior is normal. Judgment and thought content normal.  Nursing note and vitals reviewed.    ED Treatments / Results  DIAGNOSTIC STUDIES: Oxygen Saturation is 96% on RA, normal by my interpretation.    COORDINATION OF CARE: 2:59 AM- Pt advised of plan for treatment and pt agrees. Pt will receive lab work, EKG, and chest x-ray for further evaluation. She will also receive IV fluids, Zosyn, and Vancomycin.    Labs (all labs ordered are listed, but only abnormal results are displayed) Labs Reviewed  CBC WITH DIFFERENTIAL/PLATELET - Abnormal; Notable for the following:       Result Value   RBC 1.50 (*)    Hemoglobin 4.7 (*)    HCT 14.6 (*)    Platelets 84 (*)    Lymphs Abs 0.4 (*)    Monocytes Absolute 1.6 (*)    All other components within normal limits  URINALYSIS, ROUTINE W REFLEX MICROSCOPIC (NOT [facility]) - Abnormal; Notable for the following:    APPearance CLOUDY (*)    Hgb urine dipstick MODERATE (*)    Nitrite POSITIVE (*)    Leukocytes, UA MODERATE (*)    All other components within normal limits  COMPREHENSIVE METABOLIC PANEL - Abnormal; Notable for the following:    Sodium 130 (*)    Chloride 100 (*)    Glucose, Bld 138 (*)    BUN 29 (*)    Creatinine, Ser 1.52 (*)    Calcium  7.9 (*)    Total Protein 4.2 (*)    Albumin 2.2 (*)    AST 48 (*)  Total Bilirubin 1.4 (*)    GFR calc non Af Amer 34 (*)    GFR calc Af Amer 39 (*)    All other components within normal limits  CBC WITH DIFFERENTIAL/PLATELET - Abnormal; Notable for the following:    WBC 13.3 (*)    RBC 2.39 (*)    Hemoglobin 7.7 (*)    HCT 23.2 (*)    Platelets 140 (*)    Neutro Abs 9.6 (*)    Monocytes Absolute 3.0 (*)    All other components within normal limits  URINE MICROSCOPIC-ADD ON - Abnormal; Notable for the following:    Squamous Epithelial / LPF 0-5 (*)    Bacteria, UA MANY (*)    All other components within normal limits  CULTURE, BLOOD (ROUTINE X 2)  CULTURE, BLOOD (ROUTINE X 2)  URINE CULTURE  I-STAT CG4 LACTIC ACID, ED  I-STAT CG4 LACTIC ACID, ED  PREPARE RBC (CROSSMATCH)  TYPE AND SCREEN    EKG  EKG Interpretation  Date/Time:  Wednesday September 22 2016 02:45:12 EDT Ventricular Rate:  85 PR Interval:    QRS Duration: 85 QT Interval:  348 QTC Calculation: 414 R Axis:   14 Text Interpretation:  Sinus rhythm Probable left atrial enlargement Low voltage, extremity leads When compared with ECG of 09/02/2015, No significant change was found Confirmed by [facility]  MD, Tashari Schoenfelder ([id number]) on 09/22/2016 3:01:36 AM       Radiology Dg Chest Port 1 View  Result Date: 09/22/2016 CLINICAL DATA:  70 year old female with fever and hypotension. EXAM: PORTABLE CHEST 1 VIEW COMPARISON:  Chest radiograph dated 09/02/2015 FINDINGS: There is mild cardiomegaly with increased central vascular prominence. There is no focal consolidation, pleural effusion, or pneumothorax. Bilateral shoulder arthroplasties. No acute fracture. IMPRESSION: Stable cardiomegaly with mild vascular congestion. No focal consolidation. Electronically Signed   By: Elgie Collard M.D.   On: 09/22/2016 03:29    Procedures Procedures (including critical care time) CRITICAL CARE Performed by: UEAVW,[email or web address] Total critical care  time: 95 minutes Critical care time was exclusive of separately billable procedures and treating other patients. Critical care was necessary to treat or prevent imminent or life-threatening deterioration. Critical care was time spent personally by me on the following activities: development of treatment plan with patient and/or surrogate as well as nursing, discussions with consultants, evaluation of patient's response to treatment, examination of patient, obtaining history from patient or surrogate, ordering and performing treatments and interventions, ordering and review of laboratory studies, ordering and review of radiographic studies, pulse oximetry and re-evaluation of patient's condition.  Medications Ordered in ED Medications  sodium chloride 0.9 % bolus 1,000 mL (0 mLs Intravenous Stopped 09/22/16 0346)    And  sodium chloride 0.9 % bolus 1,000 mL (0 mLs Intravenous Stopped 09/22/16 0346)    And  sodium chloride 0.9 % bolus 1,000 mL (0 mLs Intravenous Stopped 09/22/16 0359)    And  sodium chloride 0.9 % bolus 1,000 mL (0 mLs Intravenous Stopped 09/22/16 0444)  piperacillin-tazobactam (ZOSYN) IVPB 3.375 g (0 g Intravenous Stopped 09/22/16 0346)  vancomycin (VANCOCIN) 2,000 mg in sodium chloride 0.9 % 500 mL IVPB (0 mg Intravenous Stopped 09/22/16 0523)     Initial Impression / Assessment and Plan / ED Course  Dione Booze, MD has reviewed the triage vital signs and the nursing notes.  Pertinent labs & imaging results that were available during my care of the patient were reviewed by me and considered in my medical decision making (see chart for details).  Clinical Course   Patient presented with hypotension and recent fever. In spite of hypertension, she did not appear overtly toxic. However, code sepsis was initiated. On review of old records, it is noted that she is about 6 weeks postop total knee replacement and is still anticoagulated on rivaroxaban. Early goal-directed fluid therapy  was initiated and she is started on antibiotics for sepsis of unknown source. Chest x-ray showed no evidence of pneumonia. Urinalysis did show evidence of urinary tract infection and this is the apparent source. Initial laboratory workup showed hemoglobin of 4.7. Based on this, blood was ordered for transfusion. Recent hemoglobin seems to be about 10. On questioning, she does admit that her stools have been somewhat dark. However, laboratory called and stated that they were having some technical difficulties with her machine and renin use sample and hemoglobin came back at 7.7. This is still a drop from baseline but not nearly as critical. She shows evidence of an acute kidney injury. This will need to be followed as an inpatient. Blood pressure came up to the mid 90s. She continued to be mentating well. Initial lactic acid level was normal. Case is discussed with Dr. Clyde Lundborg of [facility] who agrees to admit the patient to stepdown unit, inpatient status.  Final Clinical Impressions(s) / ED Diagnoses   Final diagnoses:  Sepsis, due to unspecified organism [facility])  Urinary tract infection without hematuria, site unspecified  Normochromic normocytic anemia  Acute kidney injury (nontraumatic) (HCC)  Hyponatremia    New Prescriptions New Prescriptions   No medications on file   I personally performed the services described in this documentation, which was scribed in my presence. The recorded information has been reviewed and is accurate.       Dione Booze, MD 09/22/16 [phone]

## 2016-09-22 NOTE — H&P (Addendum)
History and Physical    CEAZIA HARB BJY:[id number] DOB: [birth year] DOA: 09/22/2016  Referring MD/NP/PA:   PCP: Cain Saupe, MD   Patient coming from:  The patient is coming from home.  At baseline, pt is independent for most of ADL.    Chief Complaint: Fever, chills, generalized weakness  HPI: Kathy Young is a 70 y.o. female with medical history significant of hypertension, hyperlipidemia, diabetes mellitus, hypothyroidism, depression, lymphedema of legs, obesity, OSA not compliant to CPAP, anemia, trigeminal neuralgia, seizure, recent right knee placement on 08/24/16 (on Xarelto),who presents with fever, chills, generalized weakness.  Patient states that she has been having fever, chills, sore throat, generalized weakness for 3 days. She had fall, but strongly denies any injury to head or neck. Patient denies chest pain, shortness breath, cough, nausea, vomiting, diarrhea, abdominal pain. She also denies symptoms of UTI. She states that her stool is little dark, but does not think it is blood. No unilateral weakness, vision change or hearing loss. Patient is oriented 3, but drowsy and falls asleep easily when interviewed in ED. Pt was seen by PCP on Monday for sore throat and treated her with medication for viral infection (patient does not remember the name of medication). 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 She has been on Xarelto for DVT PPx ( has 3 day left).  ED Course: pt was found to have hypotension 78/44 which improved to 109/59 after 4 L normal saline bolus, positive urinalysis with moderate, leukocyte and positive nitrates, WBC 13.3, lactic acid 0.53, initial hemoglobin 4.7 on first CBC, but becomes 7.7 on repeated CBC (hemoglobin was 9.8 on 08/26/16), AKI with cre 1.52,  sodium 130, temperature 99. Tetanus is negative for infiltration. Pt is admitted to SDU as inpt  Review of Systems:   General: has fevers, chills, no changes in body weight, has poor appetite,  has fatigue HEENT: no blurry vision, hearing changes. Has sore throat Respiratory: no dyspnea, coughing, wheezing CV: no chest pain, no palpitations GI: no nausea, vomiting, abdominal pain, diarrhea, constipation GU: no dysuria, burning on urination, increased urinary frequency, hematuria  Ext: has leg edema Neuro: no unilateral weakness, numbness, or tingling, no vision change or hearing loss Skin: no rash, no skin tear. MSK: No muscle spasm, no deformity, no limitation of range of movement in spin Heme: No easy bruising.  Travel history: No recent long distant travel.  Allergy:  Allergies  Allergen Reactions  . Dilantin [Phenytoin Sodium Extended] Hives and Itching  . Tegretol [Carbamazepine] Hives and Itching  . Adhesive [Tape] Rash  . Latex Itching and Rash    Past Medical History:  Diagnosis Date  . Anemia   . Anxiety   . Arthritis   . Complication of anesthesia    Hypoventalation and de sats after shoulder surgery- has sleep apnea, but can not use CPAP due to Trigeminal Neuralgia  . Depression   . Diabetes mellitus without complication (HCC)    type II  . Hypercholesteremia   . Hypertension   . Hypothyroidism   . Lymphedema    legs, bilateral - wears support hose  . Lymphedema of extremity   . Noncompliance with CPAP treatment   . Obesity   . OSA (obstructive sleep apnea)   . Pneumonia    infant  . Primary localized osteoarthritis of left knee 08/24/2016  . Primary localized osteoarthritis of right knee 02/24/2016  . Reflux   . Seizures (HCC)    not for 20  years. does not see a neurologist 08/12/16- not for 30 years  . Severe obesity (BMI >= 40) (HCC) 02/24/2016  . SOB (shortness of breath)   . Trigeminal neuralgia   . Trigeminal neuralgia pain   . Urinary incontinence   . Vitamin D deficiency     Past Surgical History:  Procedure Laterality Date  . APPENDECTOMY    . CARPAL TUNNEL RELEASE    . CHOLECYSTECTOMY    . COLONOSCOPY    . OVARIAN CYST REMOVAL      . SHOULDER SURGERY    . TONSILLECTOMY    . TOTAL KNEE ARTHROPLASTY Right 02/24/2016   Procedure: TOTAL KNEE ARTHROPLASTY;  Surgeon: Teryl Lucy, MD;  Location: [facility] OR;  Service: Orthopedics;  Laterality: Right;  . TOTAL KNEE ARTHROPLASTY Left 08/24/2016   Procedure: TOTAL KNEE ARTHROPLASTY;  Surgeon: Teryl Lucy, MD;  Location: [facility] OR;  Service: Orthopedics;  Laterality: Left;  . TUBAL LIGATION      Social History:  reports that she has never smoked. She has never used smokeless tobacco. She reports that she does not drink alcohol or use drugs.  Family History:  Family History  Problem Relation Age of Onset  . Cancer Mother   . Hypertension Mother   . Diabetes Father   . Heart disease Father   . Heart attack Father   . Diabetes Sister   . Diabetes Brother      Prior to Admission medications   Medication Sig Start Date End Date Taking? Authorizing Provider  atorvastatin (LIPITOR) 10 MG tablet Take 10 mg by mouth daily at 6 PM. For hypercholesteremia    Historical Provider, MD  baclofen (LIORESAL) 10 MG tablet Take 1 tablet (10 mg total) by mouth 3 (three) times daily. As needed for muscle spasm 08/24/16   Teryl Lucy, MD  cholecalciferol (VITAMIN D) 1000 UNITS tablet Take 1,000 Units by mouth daily.    Historical Provider, MD  diclofenac sodium (VOLTAREN) 1 % GEL Apply 2 g topically 2 (two) times daily as needed (for pain).     Historical Provider, MD  escitalopram (LEXAPRO) 20 MG tablet Take 20 mg by mouth at bedtime. For depression    Historical Provider, MD  gabapentin (NEURONTIN) 400 MG capsule Take 400 mg by mouth 2 (two) times daily.    Historical Provider, MD  levothyroxine (SYNTHROID, LEVOTHROID) 200 MCG tablet Take 200 mcg by mouth daily before breakfast. **brand name only* For hypothyroidism    Historical Provider, MD  lisinopril (PRINIVIL,ZESTRIL) 20 MG tablet Take 20 mg by mouth daily. For HTN    Historical Provider, MD  metFORMIN (GLUCOPHAGE-XR) 500 MG 24 hr tablet Take  500 mg by mouth 2 (two) times daily with a meal. For diabetes 01/16/16   Historical Provider, MD  Multiple Vitamin (MULTIVITAMIN) tablet Take 1 tablet by mouth daily.    Historical Provider, MD  ondansetron (ZOFRAN) 4 MG tablet Take 1 tablet (4 mg total) by mouth every 8 (eight) hours as needed for nausea or vomiting. 08/24/16   Teryl Lucy, MD  oxybutynin (DITROPAN XL) 15 MG 24 hr tablet Take 15 mg by mouth every morning. For urinary incontinence    Historical Provider, MD  oxyCODONE-acetaminophen (PERCOCET) 10-325 MG tablet Take 1-2 tablets by mouth every 6 (six) hours as needed for pain. MAXIMUM TOTAL ACETAMINOPHEN DOSE IS 4000 MG PER DAY 08/24/16   Teryl Lucy, MD  rivaroxaban (XARELTO) 10 MG TABS tablet Take 1 tablet (10 mg total) by mouth daily. 08/24/16   Ivin Booty  Dion SaucierLandau, MD  sennosides-docusate sodium (SENOKOT-S) 8.6-50 MG tablet Take 2 tablets by mouth daily. 08/24/16   Teryl LucyJoshua Landau, MD  traMADol (ULTRAM) 50 MG tablet Take 50 mg by mouth every 6 (six) hours as needed (for pain).     Historical Provider, MD  valACYclovir (VALTREX) 500 MG tablet Take 500 mg by mouth daily. 07/28/15   Historical Provider, MD    Physical Exam: Vitals:   09/22/16 0430 09/22/16 0500 09/22/16 0530 09/22/16 0600  BP: (!) 93/46 104/56 (!) 94/54 102/59  Pulse: 66 71 64 67  Resp: 18 15 16 18   Temp:      TempSrc:      SpO2: 93% 93% 94% 98%  Weight:      Height:       General: Not in acute distress HEENT:       Eyes: PERRL, EOMI, no scleral icterus.       ENT: No discharge from the ears and nose, no pharynx injection, no tonsillar enlargement.        Neck: No JVD, no bruit, no mass felt. Heme: No neck lymph node enlargement. Cardiac: S1/S2, RRR, No murmurs, No gallops or rubs. Respiratory: No rales, wheezing, rhonchi or rubs. GI: Soft, nondistended, nontender, no rebound pain, no organomegaly, BS present. GU: No hematuria Ext: 1+ pitting leg edema bilaterally due to lymphedema. 2+DP/PT pulse  bilaterally. Musculoskeletal: No joint deformities, No joint redness or warmth, no limitation of ROM in spin. Skin: No rashes.  Neuro: drowsy, but oriented X3, cranial nerves II-XII grossly intact, moves all extremities normally.  Psych: Patient is not psychotic, no suicidal or hemocidal ideation.  Labs on Admission: I have personally reviewed following labs and imaging studies  CBC:  Recent Labs Lab 09/22/16 0301 09/22/16 0340  WBC 8.0 13.3*  NEUTROABS 6.0 9.6*  HGB 4.7* 7.7*  HCT 14.6* 23.2*  MCV 97.3 97.1  PLT 84* 140*   Basic Metabolic Panel:  Recent Labs Lab 09/22/16 0339  NA 130*  K 4.4  CL 100*  CO2 25  GLUCOSE 138*  BUN 29*  CREATININE 1.52*  CALCIUM 7.9*   GFR: Estimated Creatinine Clearance: 51.1 mL/min (by C-G formula based on SCr of 1.52 mg/dL (H)). Liver Function Tests:  Recent Labs Lab 09/22/16 0339  AST 48*  ALT 22  ALKPHOS 71  BILITOT 1.4*  PROT 4.2*  ALBUMIN 2.2*   No results for input(s): LIPASE, AMYLASE in the last 168 hours. No results for input(s): AMMONIA in the last 168 hours. Coagulation Profile: No results for input(s): INR, PROTIME in the last 168 hours. Cardiac Enzymes: No results for input(s): CKTOTAL, CKMB, CKMBINDEX, TROPONINI in the last 168 hours. BNP (last 3 results) No results for input(s): PROBNP in the last 8760 hours. HbA1C: No results for input(s): HGBA1C in the last 72 hours. CBG: No results for input(s): GLUCAP in the last 168 hours. Lipid Profile: No results for input(s): CHOL, HDL, LDLCALC, TRIG, CHOLHDL, LDLDIRECT in the last 72 hours. Thyroid Function Tests: No results for input(s): TSH, T4TOTAL, FREET4, T3FREE, THYROIDAB in the last 72 hours. Anemia Panel: No results for input(s): VITAMINB12, FOLATE, FERRITIN, TIBC, IRON, RETICCTPCT in the last 72 hours. Urine analysis:    Component Value Date/Time   COLORURINE YELLOW 09/22/2016 0445   APPEARANCEUR CLOUDY (A) 09/22/2016 0445   LABSPEC 1.011 09/22/2016  0445   PHURINE 6.0 09/22/2016 0445   GLUCOSEU NEGATIVE 09/22/2016 0445   HGBUR MODERATE (A) 09/22/2016 0445   BILIRUBINUR NEGATIVE 09/22/2016 0445   Lavenia AtlasKETONESUR  NEGATIVE 09/22/2016 0445   PROTEINUR NEGATIVE 09/22/2016 0445   NITRITE POSITIVE (A) 09/22/2016 0445   LEUKOCYTESUR MODERATE (A) 09/22/2016 0445   Sepsis Labs: @LABRCNTIP (procalcitonin:4,lacticidven:4) )No results found for this or any previous visit (from the past 240 hour(s)).   Radiological Exams on Admission: Dg Chest Port 1 View  Result Date: 09/22/2016 CLINICAL DATA:  70 year old female with fever and hypotension. EXAM: PORTABLE CHEST 1 VIEW COMPARISON:  Chest radiograph dated 09/02/2015 FINDINGS: There is mild cardiomegaly with increased central vascular prominence. There is no focal consolidation, pleural effusion, or pneumothorax. Bilateral shoulder arthroplasties. No acute fracture. IMPRESSION: Stable cardiomegaly with mild vascular congestion. No focal consolidation. Electronically Signed   By: Elgie Collard M.D.   On: 09/22/2016 03:29     EKG: Independently reviewed. Sinus rhythm, QTC 414, low voltage, LAE.    Assessment/Plan Principal Problem:   UTI (urinary tract infection) Active Problems:   Severe obesity (BMI >= 40) (HCC)   Controlled type 2 diabetes mellitus without complication (HCC)   Adult hypothyroidism   HLD (hyperlipidemia)   Essential (primary) hypertension   Depression   Normocytic anemia   Lymphedema of extremity   Hypotension   Sepsis (HCC)   Acute kidney injury (nontraumatic) (HCC)   Sepsis due to UTI: Patient is septic with leukocytosis, fever and hypotension. Patient denies symptoms of UTI, but her urine is positive, indicating possible UTI. Chest x-ray is negative for infiltration. No other source of infection identified. Initial blood pressure 78/44, which responded to IV fluid treatment, and improved to 109/57 after 42 normal saline bolus. Patient is drowsy, but oriented 3. Lactate  is normal.   - Admit to SDU as inpt (pt presents with severe sepsis with hypotension, requiring inpatient status due to high intensity of service, high risk for further deterioration and high frequency of surveillance required. I certify that at the point of admission it is my clinical judgment that the patient will require inpatient hospital care spanning beyond 2 midnights from the point of admission). -  IV Abx: pt was started with IV vanco and zosyn in ED-->will continue Zosyn and hold off Vanco now - Follow up results of urine and blood cx and amend antibiotic regimen if needed per sensitivity results - prn Zofran for nausea - will get Procalcitonin and trend lactic acid levels per sepsis protocol. -  IVF: 4L of NS bolus in ED, followed by 100 cc/h -  check rapid strep for sore throat  DM-II: Last A1c 5.8 on 07/21/16, well controled. Patient is taking metformin at home -SSI  Hypothyroidism: Last TSH was not on record -Continue home Synthroid  HLD: Last LDL was not on record -Continue home medications: Lipitor  HTN: now has hypotension -Hold Lisinopril  Depression and anxiety: Stable, no suicidal or homicidal ideations. -Continue home medications: Lexapro  Normocytic anemia: Hemoglobin dropped from 9.8-->7.7, likely due to previous surgery of right knee replacement. Patient has a dark stool, but does not think it is black. -Check FOBT -Anemia panel  AKI: Likely due to prerenal secondary to dehydration and continuation of ACEI. UTI may have also contributed. Another possibility is ATN 2/2 hypotension - IVF as above - Check FeNa - Follow up renal function by BMP - Hold her lisinopril   Sepsis - reassessment   Performed at:    6:57 AM   Last Vitals:    Blood pressure 109/59, pulse 64, temperature 99 F (37.2 C), temperature source Oral, resp. rate 16, height 5\' 9"  (1.753 m), weight 132.5 kg (  292 lb), SpO2 94 %.  Heart:                  Regular rhythm   Lungs:     Clear to  auscultation  Capillary Refill:   <2 seconds    Peripheral Pulse (include location): Brachial pulse palpable   Skin (include color):              normal    DVT ppx: SQ Lovenox Code Status: Full code Family Communication: yes, patient's brother at bed side Disposition Plan:  Anticipate discharge back to previous home environment Consults called:  none Admission status: SDU/inpation       Date of Service 09/22/2016    Lorretta Harp [facility] Pager [phone]  If 7PM-7AM, please contact night-coverage [email or web address] Password [email or web address] 09/22/2016, 6:57 AM

## 2016-09-23 DIAGNOSIS — A4151 Sepsis due to Escherichia coli [E. coli]: Principal | ICD-10-CM

## 2016-09-23 DIAGNOSIS — R7881 Bacteremia: Secondary | ICD-10-CM

## 2016-09-23 DIAGNOSIS — I9589 Other hypotension: Secondary | ICD-10-CM

## 2016-09-23 DIAGNOSIS — B9620 Unspecified Escherichia coli [E. coli] as the cause of diseases classified elsewhere: Secondary | ICD-10-CM

## 2016-09-23 LAB — GLUCOSE, CAPILLARY
GLUCOSE-CAPILLARY: 91 mg/dL (ref 65–99)
GLUCOSE-CAPILLARY: 110 mg/dL — AB (ref 65–99)
GLUCOSE-CAPILLARY: 123 mg/dL — AB (ref 65–99)
Glucose-Capillary: 100 mg/dL — ABNORMAL HIGH (ref 65–99)
Glucose-Capillary: 125 mg/dL — ABNORMAL HIGH (ref 65–99)

## 2016-09-23 LAB — BASIC METABOLIC PANEL
ANION GAP: 8 (ref 5–15)
BUN: 20 mg/dL (ref 6–20)
CHLORIDE: 106 mmol/L (ref 101–111)
CO2: 22 mmol/L (ref 22–32)
Calcium: 8.4 mg/dL — ABNORMAL LOW (ref 8.9–10.3)
Creatinine, Ser: 1.34 mg/dL — ABNORMAL HIGH (ref 0.44–1.00)
GFR calc Af Amer: 46 mL/min — ABNORMAL LOW (ref >60)
GFR calc non Af Amer: 39 mL/min — ABNORMAL LOW (ref >60)
GLUCOSE: 98 mg/dL (ref 65–99)
POTASSIUM: 4.1 mmol/L (ref 3.5–5.1)
Sodium: 136 mmol/L (ref 135–145)

## 2016-09-23 LAB — CBC
HEMATOCRIT: 26.7 % — AB (ref 36.0–46.0)
HEMOGLOBIN: 8.5 g/dL — AB (ref 12.0–15.0)
MCH: 31.4 pg (ref 26.0–34.0)
MCHC: 31.8 g/dL (ref 30.0–36.0)
MCV: 98.5 fL (ref 78.0–100.0)
Platelets: 154 10*3/uL (ref 150–400)
RBC: 2.71 MIL/uL — ABNORMAL LOW (ref 3.87–5.11)
RDW: 14.4 % (ref 11.5–15.5)
WBC: 12.3 10*3/uL — ABNORMAL HIGH (ref 4.0–10.5)

## 2016-09-23 MED ORDER — ONDANSETRON HCL 4 MG/2ML IJ SOLN
4.0000 mg | Freq: Four times a day (QID) | INTRAMUSCULAR | Status: DC | PRN
  Administered 2016-09-24: 4 mg via INTRAVENOUS
  Filled 2016-09-23: qty 2

## 2016-09-23 NOTE — Progress Notes (Signed)
Patient transferring to 5W. Report called to receiving nurse. All questions answered. Patient's cell phone and personal belongings transferred with patient.

## 2016-09-23 NOTE — Progress Notes (Signed)
Patient evaluated yesterday in ER.   States knee is doing well, no significant pain.    Exam:  Left and right knee both have surgical wounds well approximated with no cellulitis and ROM 0-90.   A/P:  Sepsis, unclear etiology.  Possibly urologic.  Also with ABLA,   Knees look benign.  appreciate medical management of complex co morbidities and presenting physiologic compromise.    Plan to follow up with me after discharge in 1-2 weeks.  I am also available if further inpatient questions arise.   Eulas PostLANDAU,Toivo Bordon P, MD [phone]

## 2016-09-23 NOTE — Progress Notes (Signed)
PROGRESS NOTE    NATAKI MCCRUMB  ZOX:[id number] DOB: [birth year] DOA: 09/22/2016 PCP: Cain Saupe, MD   Brief Narrative: Kathy Young is a 70 y.o. female with a medical history of hypertension, hyperlipidemia, diabetes mellitus, hypothyroidism, depression, lymphedema of legs, obesity, OSA not adherent with CPAP, anemia, trigeminal neuralgia, seizures, recent left knee replacement on 08/24/2016 currently on the results of. She presented with generalized weakness, found to be in sepsis, and has Escherichia coli bacteremia   Assessment & Plan:   Principal Problem:   UTI (urinary tract infection) Active Problems:   Severe obesity (BMI >= 40) (HCC)   Controlled type 2 diabetes mellitus without complication (HCC)   Adult hypothyroidism   HLD (hyperlipidemia)   Essential (primary) hypertension   Depression   Normocytic anemia   Lymphedema of extremity   Hypotension   Sepsis (HCC)   Acute kidney injury (nontraumatic) (HCC)  Escherichia coli bacteremia Sepsis secondary to Escherichia coli Likely secondary to urinary tract infection, although urine culture is not very significant for current infection. Patient with recent surgery, however, doubt that is the source. -Continue Zosyn until blood culture results with sensitivities -Continue Zofran for nausea -Continue fluids, promote by mouth intake  Diabetes mellitus, type II Last A1c of 5.8 on 07/21/2016 -Hold home metformin -SSI  Hypothyroidism -Continue home Synthroid  Hyperlipidemia -Continue home Lipitor  Hypertension -Continue to hold lisinopril secondary to recent hypotension and mild acute kidney injury  Depression/anxiety Stable -Continue home Lexapro  Normocytic anemia Stable.  AKI Likely secondary to dehydration in setting of continued ACEi usage and bacteremia -creatinine improved   DVT prophylaxis: Lovenox subq Code Status: Full code Family Communication: None at bedside Disposition Plan: Transfer to  medical floor. Discharge in 1-2 days.   Consultants:   None  Procedures:  None  Antimicrobials:  Zosyn (10/4>>    Subjective: Patient reports feeling much better today. No chest pain or dyspnea. She has more energy  Objective: Vitals:   09/22/16 1927 09/23/16 0110 09/23/16 0413 09/23/16 0810  BP: 129/74 (!) 102/54 (!) 110/59 (!) 112/59  Pulse: 73 76 69 69  Resp: 17 16 19 13   Temp: 98.1 F (36.7 C) 98.4 F (36.9 C) 98.5 F (36.9 C) 98.3 F (36.8 C)  TempSrc: Oral Oral Oral Oral  SpO2: 98% 99% 97% 99%  Weight:      Height:        Intake/Output Summary (Last 24 hours) at 09/23/16 0927 Last data filed at 09/23/16 0648  Gross per 24 hour  Intake             2621 ml  Output             1550 ml  Net             1071 ml   Filed Weights   09/22/16 0403  Weight: 132.5 kg (292 lb)    Examination:  General exam: Appears calm and comfortable Respiratory system: Clear to auscultation. Respiratory effort normal. Cardiovascular system: S1 & S2 heard, RRR. No murmurs, rubs, gallops or clicks. Gastrointestinal system: Abdomen is nondistended, soft and nontender. No organomegaly or masses felt. Normal bowel sounds heard. Central nervous system: Alert and oriented. No focal neurological deficits. Extremities: Mild non-pitting edema. No calf tenderness Skin: No cyanosis. No rashes Psychiatry: Judgement and insight appear normal. Mood & affect appropriate.     Data Reviewed: I have personally reviewed following labs and imaging studies  CBC:  Recent Labs Lab 09/22/16 0301 09/22/16 0340 09/22/16  1159 09/23/16 0320  WBC 8.0 13.3*  --  12.3*  NEUTROABS 6.0 9.6*  --   --   HGB 4.7* 7.7* 9.6* 8.5*  HCT 14.6* 23.2* 30.2* 26.7*  MCV 97.3 97.1  --  98.5  PLT 84* 140*  --  154   Basic Metabolic Panel:  Recent Labs Lab 09/22/16 0339 09/23/16 0320  NA 130* 136  K 4.4 4.1  CL 100* 106  CO2 25 22  GLUCOSE 138* 98  BUN 29* 20  CREATININE 1.52* 1.34*  CALCIUM  7.9* 8.4*   GFR: Estimated Creatinine Clearance: 58 mL/min (by C-G formula based on SCr of 1.34 mg/dL (H)). Liver Function Tests:  Recent Labs Lab 09/22/16 0339  AST 48*  ALT 22  ALKPHOS 71  BILITOT 1.4*  PROT 4.2*  ALBUMIN 2.2*   No results for input(s): LIPASE, AMYLASE in the last 168 hours. No results for input(s): AMMONIA in the last 168 hours. Coagulation Profile:  Recent Labs Lab 09/22/16 0300  INR 2.18   Cardiac Enzymes: No results for input(s): CKTOTAL, CKMB, CKMBINDEX, TROPONINI in the last 168 hours. BNP (last 3 results) No results for input(s): PROBNP in the last 8760 hours. HbA1C: No results for input(s): HGBA1C in the last 72 hours. CBG:  Recent Labs Lab 09/22/16 1700 09/22/16 1932 09/22/16 2254 09/23/16 0425 09/23/16 0809  GLUCAP 136* 103* 119* 100* 91   Lipid Profile: No results for input(s): CHOL, HDL, LDLCALC, TRIG, CHOLHDL, LDLDIRECT in the last 72 hours. Thyroid Function Tests: No results for input(s): TSH, T4TOTAL, FREET4, T3FREE, THYROIDAB in the last 72 hours. Anemia Panel:  Recent Labs  09/22/16 0806  VITAMINB12 2,118*  FOLATE 35.2  FERRITIN 170  TIBC 231*  IRON 14*  RETICCTPCT 1.3   Sepsis Labs:  Recent Labs Lab 09/22/16 0300 09/22/16 0311 09/22/16 0841 09/22/16 1229  PROCALCITON 6.82  --   --   --   LATICACIDVEN  --  0.53 1.42 1.47    Recent Results (from the past 240 hour(s))  Blood Culture (routine x 2)     Status: Abnormal (Preliminary result)   Collection Time: 09/22/16  3:29 AM  Result Value Ref Range Status   Specimen Description BLOOD RIGHT HAND  Final   Special Requests IN PEDIATRIC BOTTLE  Final   Culture  Setup Time   Final    AEROBIC BOTTLE ONLY GRAM NEGATIVE RODS CRITICAL RESULT CALLED TO, READ BACK BY AND VERIFIED WITH: L. BAJBUS, PHARMD AT 2048 ON 09/22/16 BY C. JESSUP, MLT.    Culture ESCHERICHIA COLI (A)  Final   Report Status PENDING  Incomplete  Blood Culture ID Panel (Reflexed)      Status: Abnormal   Collection Time: 09/22/16  3:29 AM  Result Value Ref Range Status   Enterococcus species NOT DETECTED NOT DETECTED Final   Listeria monocytogenes NOT DETECTED NOT DETECTED Final   Staphylococcus species NOT DETECTED NOT DETECTED Final   Staphylococcus aureus NOT DETECTED NOT DETECTED Final   Streptococcus species NOT DETECTED NOT DETECTED Final   Streptococcus agalactiae NOT DETECTED NOT DETECTED Final   Streptococcus pneumoniae NOT DETECTED NOT DETECTED Final   Streptococcus pyogenes NOT DETECTED NOT DETECTED Final   Acinetobacter baumannii NOT DETECTED NOT DETECTED Final   Enterobacteriaceae species DETECTED (A) NOT DETECTED Final    Comment: CRITICAL RESULT CALLED TO, READ BACK BY AND VERIFIED WITH: L. BAJBUS, PHARMD AT 2048 ON 09/22/16 BY C. JESSUP, MLT.    Enterobacter cloacae complex NOT DETECTED NOT DETECTED  Final   Escherichia coli DETECTED (A) NOT DETECTED Final    Comment: CRITICAL RESULT CALLED TO, READ BACK BY AND VERIFIED WITH: L. BAJBUS, PHARMD AT 2048 ON 09/22/16 BY C. JESSUP, MLT.    Klebsiella oxytoca NOT DETECTED NOT DETECTED Final   Klebsiella pneumoniae NOT DETECTED NOT DETECTED Final   Proteus species NOT DETECTED NOT DETECTED Final   Serratia marcescens NOT DETECTED NOT DETECTED Final   Carbapenem resistance NOT DETECTED NOT DETECTED Final   Haemophilus influenzae NOT DETECTED NOT DETECTED Final   Neisseria meningitidis NOT DETECTED NOT DETECTED Final   Pseudomonas aeruginosa NOT DETECTED NOT DETECTED Final   Candida albicans NOT DETECTED NOT DETECTED Final   Candida glabrata NOT DETECTED NOT DETECTED Final   Candida krusei NOT DETECTED NOT DETECTED Final   Candida parapsilosis NOT DETECTED NOT DETECTED Final   Candida tropicalis NOT DETECTED NOT DETECTED Final  Urine culture     Status: Abnormal (Preliminary result)   Collection Time: 09/22/16  4:45 AM  Result Value Ref Range Status   Specimen Description URINE, CATHETERIZED  Final    Special Requests NONE  Final   Culture (A)  Final    20,000 COLONIES/mL GRAM NEGATIVE RODS CULTURE REINCUBATED FOR BETTER GROWTH    Report Status PENDING  Incomplete  Rapid strep screen (not [facility])     Status: None   Collection Time: 09/22/16  7:40 AM  Result Value Ref Range Status   Streptococcus, Group A Screen (Direct) NEGATIVE NEGATIVE Final    Comment: (NOTE) A Rapid Antigen test may result negative if the antigen level in the sample is below the detection level of this test. The FDA has not cleared this test as a stand-alone test therefore the rapid antigen negative result has reflexed to a Group A Strep culture.   MRSA PCR Screening     Status: None   Collection Time: 09/22/16  3:45 PM  Result Value Ref Range Status   MRSA by PCR NEGATIVE NEGATIVE Final    Comment:        The GeneXpert MRSA Assay (FDA approved for NASAL specimens only), is one component of a comprehensive MRSA colonization surveillance program. It is not intended to diagnose MRSA infection nor to guide or monitor treatment for MRSA infections.          Radiology Studies: Dg Chest Port 1 View  Result Date: 09/22/2016 CLINICAL DATA:  70 year old female with fever and hypotension. EXAM: PORTABLE CHEST 1 VIEW COMPARISON:  Chest radiograph dated 09/02/2015 FINDINGS: There is mild cardiomegaly with increased central vascular prominence. There is no focal consolidation, pleural effusion, or pneumothorax. Bilateral shoulder arthroplasties. No acute fracture. IMPRESSION: Stable cardiomegaly with mild vascular congestion. No focal consolidation. Electronically Signed   By: Elgie CollardArash  Radparvar M.D.   On: 09/22/2016 03:29        Scheduled Meds: . atorvastatin  10 mg Oral q1800  . cholecalciferol  1,000 Units Oral Daily  . enoxaparin (LOVENOX) injection  40 mg Subcutaneous Daily  . escitalopram  20 mg Oral QHS  . gabapentin  400 mg Oral BID  . insulin aspart  0-5 Units Subcutaneous QHS  . insulin aspart   0-9 Units Subcutaneous TID WC  . levothyroxine  200 mcg Oral QAC breakfast  . multivitamin with minerals  1 tablet Oral Daily  . oxybutynin  15 mg Oral q morning - 10a  . piperacillin-tazobactam (ZOSYN)  IV  3.375 g Intravenous Q8H  . senna-docusate  2 tablet Oral Daily  .  sodium chloride flush  3 mL Intravenous Q12H  . valACYclovir  500 mg Oral Daily   Continuous Infusions: . sodium chloride 100 mL/hr at 09/22/16 1722     LOS: 1 day     Jacquelin Hawking [facility] 09/23/2016, 9:27 AM Pager: [phone]  If 7PM-7AM, please contact night-coverage [email or web address] Password [email or web address] 09/23/2016, 9:27 AM

## 2016-09-24 LAB — BASIC METABOLIC PANEL
Anion gap: 11 (ref 5–15)
BUN: 13 mg/dL (ref 6–20)
CHLORIDE: 108 mmol/L (ref 101–111)
CO2: 20 mmol/L — AB (ref 22–32)
CREATININE: 1.32 mg/dL — AB (ref 0.44–1.00)
Calcium: 8.8 mg/dL — ABNORMAL LOW (ref 8.9–10.3)
GFR calc Af Amer: 47 mL/min — ABNORMAL LOW (ref >60)
GFR calc non Af Amer: 40 mL/min — ABNORMAL LOW (ref >60)
GLUCOSE: 108 mg/dL — AB (ref 65–99)
POTASSIUM: 3.8 mmol/L (ref 3.5–5.1)
SODIUM: 139 mmol/L (ref 135–145)

## 2016-09-24 LAB — CBC
HCT: 28.2 % — ABNORMAL LOW (ref 36.0–46.0)
HEMOGLOBIN: 9.1 g/dL — AB (ref 12.0–15.0)
MCH: 31.4 pg (ref 26.0–34.0)
MCHC: 32.3 g/dL (ref 30.0–36.0)
MCV: 97.2 fL (ref 78.0–100.0)
Platelets: 192 10*3/uL (ref 150–400)
RBC: 2.9 MIL/uL — ABNORMAL LOW (ref 3.87–5.11)
RDW: 14.6 % (ref 11.5–15.5)
WBC: 11.7 10*3/uL — ABNORMAL HIGH (ref 4.0–10.5)

## 2016-09-24 LAB — CULTURE, GROUP A STREP (THRC)

## 2016-09-24 LAB — CULTURE, BLOOD (ROUTINE X 2)

## 2016-09-24 LAB — GLUCOSE, CAPILLARY
GLUCOSE-CAPILLARY: 104 mg/dL — AB (ref 65–99)
Glucose-Capillary: 106 mg/dL — ABNORMAL HIGH (ref 65–99)
Glucose-Capillary: 130 mg/dL — ABNORMAL HIGH (ref 65–99)

## 2016-09-24 MED ORDER — CEPHALEXIN 500 MG PO CAPS: 500.0000 mg | ORAL_CAPSULE | Freq: Four times a day (QID) | ORAL | 0 refills | Status: AC

## 2016-09-24 MED ORDER — DEXTROSE 5 % IV SOLN
2.0000 g | INTRAVENOUS | Status: DC
  Administered 2016-09-24: 2 g via INTRAVENOUS
  Filled 2016-09-24: qty 2

## 2016-09-24 MED ORDER — VALACYCLOVIR HCL 500 MG PO TABS: 500.0000 mg | ORAL_TABLET | Freq: Every day | ORAL | 0 refills | Status: AC

## 2016-09-24 MED ORDER — CEPHALEXIN 500 MG PO CAPS: 500.0000 mg | ORAL_CAPSULE | Freq: Two times a day (BID) | ORAL | 0 refills | Status: DC

## 2016-09-24 NOTE — Discharge Summary (Addendum)
Physician Discharge Summary  NNENNA MEADOR ZOX:[id number] DOB: 1946/10/03 DOA: 09/22/2016  PCP: Cain Saupe, MD  Admit date: 09/22/2016 Discharge date: 09/24/2016  Admitted From: Home Disposition:  Home  Recommendations for Outpatient Follow-up:  1. Follow up with PCP in 1 week 2. Please obtain BMP to recheck creatinine since ACEi restarted; metformin also restarted since AKI improved 3. Please obtain CBC to recheck hemoglobin 4. Please follow up on the following pending results:   Discharge Condition: Stable CODE STATUS: Full code Diet recommendation: Heart healthy   Brief/Interim Summary:  HPI written by Dr. Clyde Lundborg on 09/22/2016  HPI: MAHEK SCHLESINGER is a 70 y.o. female with medical history significant of hypertension, hyperlipidemia, diabetes mellitus, hypothyroidism, depression, lymphedema of legs, obesity, OSA not compliant to CPAP, anemia, trigeminal neuralgia, seizure, recent right knee placement on 08/24/16 (on Xarelto),who presents with fever, chills, generalized weakness.  Patient states that she has been having fever, chills, sore throat, generalized weakness for 3 days. She had fall, but strongly denies any injury to head or neck. Patient denies chest pain, shortness breath, cough, nausea, vomiting, diarrhea, abdominal pain. She also denies symptoms of UTI. She states that her stool is little dark, but does not think it is blood. No unilateral weakness, vision change or hearing loss. Patient is oriented 3, but drowsy and falls asleep easily when interviewed in ED. Pt was seen by PCP on Monday for sore throat and treated her with medication for viral infection (patient does not remember the name of medication). 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 She has been on Xarelto for DVT PPx ( has 3 day left).  ED Course: pt was found to have hypotension 78/44 which improved to 109/59 after 4 L normal saline bolus, positive urinalysis with moderate, leukocyte and positive  nitrates, WBC 13.3, lactic acid 0.53, initial hemoglobin 4.7 on first CBC, but becomes 7.7 on repeated CBC (hemoglobin was 9.8 on 08/26/16), AKI with cre 1.52,  sodium 130, temperature 99. Tetanus is negative for infiltration. Pt is admitted to SDU as inpt   Hospital course:  Escherichia coli bacteremia Sepsis secondary to Escherichia coli UTI secondary to E. coli Patient found to have an E.coli bacteremia. She was resuscitated with IVF and was initially treated with Vancomycin and zosyn in the ED and switched to zosyn on admission. She was switched to Keflex on discharge.  Diabetes mellitus, type II Last A1c of 5.8 on 07/21/2016. Metformin held while inpatient. Patient managed with sliding scale insulin. Metformin restarted on discharge  Hypothyroidism Continued home Synthroid  Hyperlipidemia Continued home Lipitor  Hypertension Held lisinopril while inpatient. Restarted on discharge  Depression/anxiety Continued home Lexapro  Normocytic anemia Initially low at 4.7 then 7, however, improved to baseline of 9 without intervention. Possibly an error initially  AKI Lisinopril held on admission. Creatinine improved. Likely secondary to dehydration made worse by ACEi. Lisinopril restarted on discharge.  Discharge Diagnoses:  Principal Problem:   Sepsis due to Escherichia coli (E. coli) (HCC) Active Problems:   Severe obesity (BMI >= 40) (HCC)   Controlled type 2 diabetes mellitus without complication (HCC)   Adult hypothyroidism   HLD (hyperlipidemia)   Essential (primary) hypertension   Depression   UTI (urinary tract infection)   Normocytic anemia   Lymphedema of extremity   Hypotension   Acute kidney injury (nontraumatic) (HCC)   E coli bacteremia    Discharge Instructions     Medication List    TAKE these medications  ALPRAZolam 0.5 MG tablet Commonly known as:  XANAX Take 0.5 mg by mouth at bedtime.   atorvastatin 10 MG tablet Commonly known as:   LIPITOR Take 10 mg by mouth daily at 6 PM. For hypercholesteremia   baclofen 10 MG tablet Commonly known as:  LIORESAL Take 1 tablet (10 mg total) by mouth 3 (three) times daily. As needed for muscle spasm   cephALEXin 500 MG capsule Commonly known as:  KEFLEX Take 1 capsule (500 mg total) by mouth 4 (four) times daily.   cholecalciferol 1000 units tablet Commonly known as:  VITAMIN D Take 1,000 Units by mouth daily.   diclofenac sodium 1 % Gel Commonly known as:  VOLTAREN Apply 2 g topically 2 (two) times daily as needed (for pain).   escitalopram 20 MG tablet Commonly known as:  LEXAPRO Take 20 mg by mouth at bedtime. For depression   gabapentin 400 MG capsule Commonly known as:  NEURONTIN Take 400 mg by mouth 2 (two) times daily.   levothyroxine 200 MCG tablet Commonly known as:  SYNTHROID, LEVOTHROID Take 200 mcg by mouth daily before breakfast. **brand name only* For hypothyroidism   Lidocaine HCl 2 % Soln Use as directed 5 mLs in the mouth or throat 4 (four) times daily -  before meals and at bedtime.   lisinopril 20 MG tablet Commonly known as:  PRINIVIL,ZESTRIL Take 20 mg by mouth daily. For HTN   metFORMIN 500 MG 24 hr tablet Commonly known as:  GLUCOPHAGE-XR Take 500 mg by mouth 2 (two) times daily with a meal. For diabetes   multivitamin tablet Take 1 tablet by mouth daily.   ondansetron 4 MG tablet Commonly known as:  ZOFRAN Take 1 tablet (4 mg total) by mouth every 8 (eight) hours as needed for nausea or vomiting.   oxybutynin 15 MG 24 hr tablet Commonly known as:  DITROPAN XL Take 15 mg by mouth every morning. For urinary incontinence   oxyCODONE-acetaminophen 10-325 MG tablet Commonly known as:  PERCOCET Take 1-2 tablets by mouth every 6 (six) hours as needed for pain. MAXIMUM TOTAL ACETAMINOPHEN DOSE IS 4000 MG PER DAY   rivaroxaban 10 MG Tabs tablet Commonly known as:  XARELTO Take 1 tablet (10 mg total) by mouth daily.    sennosides-docusate sodium 8.6-50 MG tablet Commonly known as:  SENOKOT-S Take 2 tablets by mouth daily. What changed:  how much to take   traMADol 50 MG tablet Commonly known as:  ULTRAM Take 50 mg by mouth 4 (four) times daily as needed for moderate pain.   valACYclovir 500 MG tablet Commonly known as:  VALTREX Take 1 tablet (500 mg total) by mouth daily. Start taking on:  09/25/2016 What changed:  Another medication with the same name was removed. Continue taking this medication, and follow the directions you see here.      Follow-up Information    LANDAU,JOSHUA P, MD. Schedule an appointment as soon as possible for a visit in 2 week(s).   Specialty:  Orthopedic Surgery Why:  make an apt to see me in 1-2 weeks. Contact information: [address] [place] [address] [phone]        Cain Saupe, MD. Schedule an appointment as soon as possible for a visit in 1 week(s).   Specialty:  Family Medicine Contact information: [address] [place] [address] [phone]          Allergies  Allergen Reactions  . Dilantin [Phenytoin Sodium Extended] Hives  and Itching  . Tegretol [Carbamazepine] Hives and Itching  . Adhesive [Tape] Rash  . Latex Itching and Rash    Consultations:  None   Procedures/Studies: Dg Chest Port 1 View  Result Date: 09/22/2016 CLINICAL DATA:  71 year old female with fever and hypotension. EXAM: PORTABLE CHEST 1 VIEW COMPARISON:  Chest radiograph dated 09/02/2015 FINDINGS: There is mild cardiomegaly with increased central vascular prominence. There is no focal consolidation, pleural effusion, or pneumothorax. Bilateral shoulder arthroplasties. No acute fracture. IMPRESSION: Stable cardiomegaly with mild vascular congestion. No focal consolidation. Electronically Signed   By: Elgie Collard M.D.   On: 09/22/2016 03:29      Subjective: Patient reports feeling good today. No complaints  Discharge  Exam: Vitals:   09/24/16 0500 09/24/16 1341  BP: (!) 117/51 (!) 113/47  Pulse: 60 65  Resp: 18   Temp: 98.4 F (36.9 C) 98.1 F (36.7 C)   Vitals:   09/23/16 1615 09/23/16 2123 09/24/16 0500 09/24/16 1341  BP: (!) 117/57 (!) 112/52 (!) 117/51 (!) 113/47  Pulse: 65 64 60 65  Resp: 17 18 18    Temp: 98.5 F (36.9 C) 98.6 F (37 C) 98.4 F (36.9 C) 98.1 F (36.7 C)  TempSrc: Oral  Oral Oral  SpO2: 95% 97% 96% 95%  Weight:      Height:        General exam: Appears calm and comfortable Respiratory system: Clear to auscultation. Respiratory effort normal. Cardiovascular system: S1 & S2 heard, RRR. Systolic murmur, rubs, gallops or clicks. Gastrointestinal system: Abdomen is nondistended, soft and nontender. No organomegaly or masses felt. Normal bowel sounds heard. Central nervous system: Alert and oriented. No focal neurological deficits. Extremities: Mild non-pitting edema. No calf tenderness. Stockings on Skin: No cyanosis. No rashes Psychiatry: Judgement and insight appear normal. Mood & affect appropriate.     The results of significant diagnostics from this hospitalization (including imaging, microbiology, ancillary and laboratory) are listed below for reference.     Microbiology: Recent Results (from the past 240 hour(s))  Blood Culture (routine x 2)     Status: None (Preliminary result)   Collection Time: 09/22/16  3:01 AM  Result Value Ref Range Status   Specimen Description BLOOD LEFT ARM  Final   Special Requests BOTTLES DRAWN AEROBIC AND ANAEROBIC  Final   Culture NO GROWTH 2 DAYS  Final   Report Status PENDING  Incomplete  Blood Culture (routine x 2)     Status: Abnormal   Collection Time: 09/22/16  3:29 AM  Result Value Ref Range Status   Specimen Description BLOOD RIGHT HAND  Final   Special Requests IN PEDIATRIC BOTTLE  Final   Culture  Setup Time   Final    AEROBIC BOTTLE ONLY GRAM NEGATIVE RODS CRITICAL RESULT CALLED TO, READ BACK BY AND  VERIFIED WITH: L. BAJBUS, PHARMD AT 2048 ON 09/22/16 BY C. JESSUP, MLT.    Culture ESCHERICHIA COLI (A)  Final   Report Status 09/24/2016 FINAL  Final   Organism ID, Bacteria ESCHERICHIA COLI  Final      Susceptibility   Escherichia coli - MIC*    AMPICILLIN >=32 RESISTANT Resistant     CEFAZOLIN <=4 SENSITIVE Sensitive     CEFEPIME <=1 SENSITIVE Sensitive     CEFTAZIDIME <=1 SENSITIVE Sensitive     CEFTRIAXONE <=1 SENSITIVE Sensitive     CIPROFLOXACIN >=4 RESISTANT Resistant     GENTAMICIN <=1 SENSITIVE Sensitive     IMIPENEM <=0.25 SENSITIVE Sensitive  TRIMETH/SULFA >=320 RESISTANT Resistant     AMPICILLIN/SULBACTAM 8 SENSITIVE Sensitive     PIP/TAZO <=4 SENSITIVE Sensitive     Extended ESBL NEGATIVE Sensitive     * ESCHERICHIA COLI  Blood Culture ID Panel (Reflexed)     Status: Abnormal   Collection Time: 09/22/16  3:29 AM  Result Value Ref Range Status   Enterococcus species NOT DETECTED NOT DETECTED Final   Listeria monocytogenes NOT DETECTED NOT DETECTED Final   Staphylococcus species NOT DETECTED NOT DETECTED Final   Staphylococcus aureus NOT DETECTED NOT DETECTED Final   Streptococcus species NOT DETECTED NOT DETECTED Final   Streptococcus agalactiae NOT DETECTED NOT DETECTED Final   Streptococcus pneumoniae NOT DETECTED NOT DETECTED Final   Streptococcus pyogenes NOT DETECTED NOT DETECTED Final   Acinetobacter baumannii NOT DETECTED NOT DETECTED Final   Enterobacteriaceae species DETECTED (A) NOT DETECTED Final    Comment: CRITICAL RESULT CALLED TO, READ BACK BY AND VERIFIED WITH: L. BAJBUS, PHARMD AT 2048 ON 09/22/16 BY C. JESSUP, MLT.    Enterobacter cloacae complex NOT DETECTED NOT DETECTED Final   Escherichia coli DETECTED (A) NOT DETECTED Final    Comment: CRITICAL RESULT CALLED TO, READ BACK BY AND VERIFIED WITH: L. BAJBUS, PHARMD AT 2048 ON 09/22/16 BY C. JESSUP, MLT.    Klebsiella oxytoca NOT DETECTED NOT DETECTED Final   Klebsiella pneumoniae NOT DETECTED  NOT DETECTED Final   Proteus species NOT DETECTED NOT DETECTED Final   Serratia marcescens NOT DETECTED NOT DETECTED Final   Carbapenem resistance NOT DETECTED NOT DETECTED Final   Haemophilus influenzae NOT DETECTED NOT DETECTED Final   Neisseria meningitidis NOT DETECTED NOT DETECTED Final   Pseudomonas aeruginosa NOT DETECTED NOT DETECTED Final   Candida albicans NOT DETECTED NOT DETECTED Final   Candida glabrata NOT DETECTED NOT DETECTED Final   Candida krusei NOT DETECTED NOT DETECTED Final   Candida parapsilosis NOT DETECTED NOT DETECTED Final   Candida tropicalis NOT DETECTED NOT DETECTED Final  Urine culture     Status: Abnormal (Preliminary result)   Collection Time: 09/22/16  4:45 AM  Result Value Ref Range Status   Specimen Description URINE, CATHETERIZED  Final   Special Requests NONE  Final   Culture 20,000 COLONIES/mL ESCHERICHIA COLI (A)  Final   Report Status PENDING  Incomplete  Rapid strep screen (not at [facility])     Status: None   Collection Time: 09/22/16  7:40 AM  Result Value Ref Range Status   Streptococcus, Group A Screen (Direct) NEGATIVE NEGATIVE Final    Comment: (NOTE) A Rapid Antigen test may result negative if the antigen level in the sample is below the detection level of this test. The FDA has not cleared this test as a stand-alone test therefore the rapid antigen negative result has reflexed to a Group A Strep culture.   Culture, group A strep     Status: None   Collection Time: 09/22/16  7:40 AM  Result Value Ref Range Status   Specimen Description THROAT  Final   Special Requests NONE Reflexed from [id number]  Final   Culture NO GROUP A STREP (S.PYOGENES) ISOLATED  Final   Report Status 09/24/2016 FINAL  Final  MRSA PCR Screening     Status: None   Collection Time: 09/22/16  3:45 PM  Result Value Ref Range Status   MRSA by PCR NEGATIVE NEGATIVE Final    Comment:        The GeneXpert MRSA Assay (FDA approved for NASAL specimens  only), is one  component of a comprehensive MRSA colonization surveillance program. It is not intended to diagnose MRSA infection nor to guide or monitor treatment for MRSA infections.   Culture, blood (routine x 2)     Status: None (Preliminary result)   Collection Time: 09/23/16  9:51 AM  Result Value Ref Range Status   Specimen Description BLOOD RIGHT ARM  Final   Special Requests IN PEDIATRIC BOTTLE  4CC  Final   Culture NO GROWTH 1 DAY  Final   Report Status PENDING  Incomplete  Culture, blood (routine x 2)     Status: None (Preliminary result)   Collection Time: 09/23/16 10:00 AM  Result Value Ref Range Status   Specimen Description BLOOD RIGHT HAND  Final   Special Requests IN PEDIATRIC BOTTLE  4CC  Final   Culture  Setup Time   Final    GRAM POSITIVE COCCI IN CLUSTERS IN PEDIATRIC BOTTLE CRITICAL RESULT CALLED TO, READ BACK BY AND VERIFIED WITH: N BATCHELDER,PHARMD AT 1020 09/24/16 BY L BENFIELD    Culture GRAM POSITIVE COCCI  Final   Report Status PENDING  Incomplete     Labs: BNP (last 3 results) No results for input(s): BNP in the last 8760 hours. Basic Metabolic Panel:  Recent Labs Lab 09/22/16 0339 09/23/16 0320 09/24/16 1255  NA 130* 136 139  K 4.4 4.1 3.8  CL 100* 106 108  CO2 25 22 20*  GLUCOSE 138* 98 108*  BUN 29* 20 13  CREATININE 1.52* 1.34* 1.32*  CALCIUM 7.9* 8.4* 8.8*   Liver Function Tests:  Recent Labs Lab 09/22/16 0339  AST 48*  ALT 22  ALKPHOS 71  BILITOT 1.4*  PROT 4.2*  ALBUMIN 2.2*   No results for input(s): LIPASE, AMYLASE in the last 168 hours. No results for input(s): AMMONIA in the last 168 hours. CBC:  Recent Labs Lab 09/22/16 0301 09/22/16 0340 09/22/16 1159 09/23/16 0320 09/24/16 1255  WBC 8.0 13.3*  --  12.3* 11.7*  NEUTROABS 6.0 9.6*  --   --   --   HGB 4.7* 7.7* 9.6* 8.5* 9.1*  HCT 14.6* 23.2* 30.2* 26.7* 28.2*  MCV 97.3 97.1  --  98.5 97.2  PLT 84* 140*  --  154 192   Cardiac Enzymes: No results for input(s):  CKTOTAL, CKMB, CKMBINDEX, TROPONINI in the last 168 hours. BNP: Invalid input(s): POCBNP CBG:  Recent Labs Lab 09/23/16 1237 09/23/16 1630 09/23/16 2119 09/24/16 0819 09/24/16 1223  GLUCAP 125* 110* 123* 104* 106*   D-Dimer No results for input(s): DDIMER in the last 72 hours. Hgb A1c No results for input(s): HGBA1C in the last 72 hours. Lipid Profile No results for input(s): CHOL, HDL, LDLCALC, TRIG, CHOLHDL, LDLDIRECT in the last 72 hours. Thyroid function studies No results for input(s): TSH, T4TOTAL, T3FREE, THYROIDAB in the last 72 hours.  Invalid input(s): FREET3 Anemia work up  Recent Labs  09/22/16 0806  VITAMINB12 2,118*  FOLATE 35.2  FERRITIN 170  TIBC 231*  IRON 14*  RETICCTPCT 1.3   Urinalysis    Component Value Date/Time   COLORURINE YELLOW 09/22/2016 0445   APPEARANCEUR CLOUDY (A) 09/22/2016 0445   LABSPEC 1.011 09/22/2016 0445   PHURINE 6.0 09/22/2016 0445   GLUCOSEU NEGATIVE 09/22/2016 0445   HGBUR MODERATE (A) 09/22/2016 0445   BILIRUBINUR NEGATIVE 09/22/2016 0445   KETONESUR NEGATIVE 09/22/2016 0445   PROTEINUR NEGATIVE 09/22/2016 0445   NITRITE POSITIVE (A) 09/22/2016 0445   LEUKOCYTESUR MODERATE (A) 09/22/2016 0445  Sepsis Labs Invalid input(s): PROCALCITONIN,  WBC,  LACTICIDVEN Microbiology Recent Results (from the past 240 hour(s))  Blood Culture (routine x 2)     Status: None (Preliminary result)   Collection Time: 09/22/16  3:01 AM  Result Value Ref Range Status   Specimen Description BLOOD LEFT ARM  Final   Special Requests BOTTLES DRAWN AEROBIC AND ANAEROBIC 5ML  Final   Culture NO GROWTH 2 DAYS  Final   Report Status PENDING  Incomplete  Blood Culture (routine x 2)     Status: Abnormal   Collection Time: 09/22/16  3:29 AM  Result Value Ref Range Status   Specimen Description BLOOD RIGHT HAND  Final   Special Requests IN PEDIATRIC BOTTLE 4ML  Final   Culture  Setup Time   Final    AEROBIC BOTTLE ONLY GRAM NEGATIVE  RODS CRITICAL RESULT CALLED TO, READ BACK BY AND VERIFIED WITH: L. BAJBUS, PHARMD AT 2048 ON 09/22/16 BY C. JESSUP, MLT.    Culture ESCHERICHIA COLI (A)  Final   Report Status 09/24/2016 FINAL  Final   Organism ID, Bacteria ESCHERICHIA COLI  Final      Susceptibility   Escherichia coli - MIC*    AMPICILLIN >=32 RESISTANT Resistant     CEFAZOLIN <=4 SENSITIVE Sensitive     CEFEPIME <=1 SENSITIVE Sensitive     CEFTAZIDIME <=1 SENSITIVE Sensitive     CEFTRIAXONE <=1 SENSITIVE Sensitive     CIPROFLOXACIN >=4 RESISTANT Resistant     GENTAMICIN <=1 SENSITIVE Sensitive     IMIPENEM <=0.25 SENSITIVE Sensitive     TRIMETH/SULFA >=320 RESISTANT Resistant     AMPICILLIN/SULBACTAM 8 SENSITIVE Sensitive     PIP/TAZO <=4 SENSITIVE Sensitive     Extended ESBL NEGATIVE Sensitive     * ESCHERICHIA COLI  Blood Culture ID Panel (Reflexed)     Status: Abnormal   Collection Time: 09/22/16  3:29 AM  Result Value Ref Range Status   Enterococcus species NOT DETECTED NOT DETECTED Final   Listeria monocytogenes NOT DETECTED NOT DETECTED Final   Staphylococcus species NOT DETECTED NOT DETECTED Final   Staphylococcus aureus NOT DETECTED NOT DETECTED Final   Streptococcus species NOT DETECTED NOT DETECTED Final   Streptococcus agalactiae NOT DETECTED NOT DETECTED Final   Streptococcus pneumoniae NOT DETECTED NOT DETECTED Final   Streptococcus pyogenes NOT DETECTED NOT DETECTED Final   Acinetobacter baumannii NOT DETECTED NOT DETECTED Final   Enterobacteriaceae species DETECTED (A) NOT DETECTED Final    Comment: CRITICAL RESULT CALLED TO, READ BACK BY AND VERIFIED WITH: L. BAJBUS, PHARMD AT 2048 ON 09/22/16 BY C. JESSUP, MLT.    Enterobacter cloacae complex NOT DETECTED NOT DETECTED Final   Escherichia coli DETECTED (A) NOT DETECTED Final    Comment: CRITICAL RESULT CALLED TO, READ BACK BY AND VERIFIED WITH: L. BAJBUS, PHARMD AT 2048 ON 09/22/16 BY C. JESSUP, MLT.    Klebsiella oxytoca NOT DETECTED NOT  DETECTED Final   Klebsiella pneumoniae NOT DETECTED NOT DETECTED Final   Proteus species NOT DETECTED NOT DETECTED Final   Serratia marcescens NOT DETECTED NOT DETECTED Final   Carbapenem resistance NOT DETECTED NOT DETECTED Final   Haemophilus influenzae NOT DETECTED NOT DETECTED Final   Neisseria meningitidis NOT DETECTED NOT DETECTED Final   Pseudomonas aeruginosa NOT DETECTED NOT DETECTED Final   Candida albicans NOT DETECTED NOT DETECTED Final   Candida glabrata NOT DETECTED NOT DETECTED Final   Candida krusei NOT DETECTED NOT DETECTED Final   Candida parapsilosis NOT DETECTED  NOT DETECTED Final   Candida tropicalis NOT DETECTED NOT DETECTED Final  Urine culture     Status: Abnormal (Preliminary result)   Collection Time: 09/22/16  4:45 AM  Result Value Ref Range Status   Specimen Description URINE, CATHETERIZED  Final   Special Requests NONE  Final   Culture 20,000 COLONIES/mL ESCHERICHIA COLI (A)  Final   Report Status PENDING  Incomplete  Rapid strep screen (not at [facility])     Status: None   Collection Time: 09/22/16  7:40 AM  Result Value Ref Range Status   Streptococcus, Group A Screen (Direct) NEGATIVE NEGATIVE Final    Comment: (NOTE) A Rapid Antigen test may result negative if the antigen level in the sample is below the detection level of this test. The FDA has not cleared this test as a stand-alone test therefore the rapid antigen negative result has reflexed to a Group A Strep culture.   Culture, group A strep     Status: None   Collection Time: 09/22/16  7:40 AM  Result Value Ref Range Status   Specimen Description THROAT  Final   Special Requests NONE Reflexed from [id number]  Final   Culture NO GROUP A STREP (S.PYOGENES) ISOLATED  Final   Report Status 09/24/2016 FINAL  Final  MRSA PCR Screening     Status: None   Collection Time: 09/22/16  3:45 PM  Result Value Ref Range Status   MRSA by PCR NEGATIVE NEGATIVE Final    Comment:        The GeneXpert MRSA Assay  (FDA approved for NASAL specimens only), is one component of a comprehensive MRSA colonization surveillance program. It is not intended to diagnose MRSA infection nor to guide or monitor treatment for MRSA infections.   Culture, blood (routine x 2)     Status: None (Preliminary result)   Collection Time: 09/23/16  9:51 AM  Result Value Ref Range Status   Specimen Description BLOOD RIGHT ARM  Final   Special Requests IN PEDIATRIC BOTTLE  4CC  Final   Culture NO GROWTH 1 DAY  Final   Report Status PENDING  Incomplete  Culture, blood (routine x 2)     Status: None (Preliminary result)   Collection Time: 09/23/16 10:00 AM  Result Value Ref Range Status   Specimen Description BLOOD RIGHT HAND  Final   Special Requests IN PEDIATRIC BOTTLE  4CC  Final   Culture  Setup Time   Final    GRAM POSITIVE COCCI IN CLUSTERS IN PEDIATRIC BOTTLE CRITICAL RESULT CALLED TO, READ BACK BY AND VERIFIED WITH: N BATCHELDER,PHARMD AT 1020 09/24/16 BY L BENFIELD    Culture GRAM POSITIVE COCCI  Final   Report Status PENDING  Incomplete     Time coordinating discharge: Over 30 minutes  SIGNED:   Jacquelin Hawking, MD [facility] 09/24/2016, 5:09 PM Pager [phone]  If 7PM-7AM, please contact night-coverage [email or web address] Password [email or web address]

## 2016-09-24 NOTE — Care Management Note (Signed)
Case Management Note  Patient Details  Name: Kathy Young MRN: [id number] Date of Birth: 12/17/1946  Subjective/Objective:                 Spoke with patient at the bedside. She lives at home alone, her brother is her support and lives 35 minutes away with their mother and assists in her care. He provides transportation to [facility] and grocery store for patient. [facility]. [place] prior to this admission. Has cane, walker, and tub bench at home. Declines further DME. She states [facility] and would like to use them again if needed. She describes herself as independent with plans to fully recooperate and drive again in the near future. She states if she needs SNF at [place] she would like to go [place] again. PCP [facility] [address]    Action/Plan:   Expected Discharge Date:                  Expected Discharge Plan:  Home w Home Health Services  In-House Referral:  NA  Discharge planning Services  CM Consult  Post Acute Care Choice:  Home Health Choice offered to:  Patient  DME Arranged:  N/A DME Agency:  NA  HH Arranged:    HH [facility]  Status of Service:  In process, will continue to follow  If discussed at Long Length of Stay Meetings, dates discussed:    Additional Comments:  Lawerance SabalDebbie Triston Skare, RN 09/24/2016, 12:26 PM

## 2016-09-24 NOTE — Evaluation (Signed)
Physical Therapy Evaluation and Discharge Patient Details Name: Kathy Young MRN: [id number] DOB: [birth year] Today's Date: 09/24/2016   History of Present Illness  70 y.o. female with medical history significant of hypertension, hyperlipidemia, diabetes mellitus, hypothyroidism, depression, lymphedema of legs, obesity, OSA not compliant to CPAP, anemia, trigeminal neuralgia, seizure, recent right knee placement on 08/24/16 (on Xarelto),who presents with fever, chills, generalized weakness.    Clinical Impression  Patient evaluated by Physical Therapy and demonstrated safe use of DME and ability to walk household distance without assistance. All education has been completed and the patient has no further questions. She would like to resume OPPT on discharge. See below for any follow-up Physical Therapy or equipment needs. PT is signing off. Thank you for this referral.     Follow Up Recommendations Outpatient PT;Supervision - Intermittent    Equipment Recommendations  None recommended by PT    Recommendations for Other Services       Precautions / Restrictions Precautions Precautions: None Restrictions Weight Bearing Restrictions: No      Mobility  Bed Mobility Overal bed mobility: Modified Independent                Transfers Overall transfer level: Needs assistance Equipment used: Rolling walker (2 wheeled) Transfers: Sit to/from Stand Sit to Stand: Min guard;Modified independent (Device/Increase time)         General transfer comment: first time up minguard for safety; additional transfer modified indpenent  Ambulation/Gait Ambulation/Gait assistance: Min guard;Modified independent (Device/Increase time) Ambulation Distance (Feet): 100 Feet Assistive device: Rolling walker (2 wheeled) Gait Pattern/deviations: Step-through pattern;Decreased stride length;Wide base of support   Gait velocity interpretation: Below normal speed for age/gender    Stairs             Wheelchair Mobility    Modified Rankin (Stroke Patients Only)       Balance Overall balance assessment: Modified Independent                                           Pertinent Vitals/Pain With activity HR 92 SaO2 97% on room air  Pain Assessment: No/denies pain    Home Living Family/patient expects to be discharged to:: Private residence Living Arrangements: Alone Available Help at Discharge: Family;Available PRN/intermittently (brother assists with groceries) Type of Home: House Home Access: Stairs to enter Entrance Stairs-Rails: None Entrance Stairs-Number of Steps: 1 Home Layout: One level Home Equipment: Environmental consultant - 2 wheels;Cane - single point;Tub bench;Bedside commode      Prior Function Level of Independence: Independent with assistive device(s)         Comments: beginning to work on using cane with OPPT     Hand Dominance   Dominant Hand: Right    Extremity/Trunk Assessment   Upper Extremity Assessment: Overall WFL for tasks assessed           Lower Extremity Assessment: Overall WFL for tasks assessed      Cervical / Trunk Assessment: Other exceptions  Communication   Communication: No difficulties  Cognition Arousal/Alertness: Awake/alert Behavior During Therapy: WFL for tasks assessed/performed Overall Cognitive Status: Within Functional Limits for tasks assessed                      General Comments General comments (skin integrity, edema, etc.): Patient's only concern re: going home is having the overall strength after 3 days in bed  Exercises     Assessment/Plan    PT Assessment All further PT needs can be met in the next venue of care  PT Problem List Decreased strength;Decreased balance;Decreased mobility;Decreased knowledge of use of DME (pt wants to progress to cane and then no device)          PT Treatment Interventions      PT Goals (Current goals can be found in the Care Plan  section)  Acute Rehab PT Goals Patient Stated Goal: return home PT Goal Formulation: All assessment and education complete, DC therapy    Frequency     Barriers to discharge        Co-evaluation               End of Session Equipment Utilized During Treatment: Gait belt Activity Tolerance: Patient tolerated treatment well Patient left: in chair;with call bell/phone within reach;with chair alarm set           Time: 1222-1251 PT Time Calculation (min) (ACUTE ONLY): 29 min   Charges:   PT Evaluation $PT Eval Low Complexity: 1 Procedure     PT G Codes:        Becci Batty 2016/09/27, 1:04 PM  Pager [phone]

## 2016-09-24 NOTE — Progress Notes (Signed)
Alerted by microbiology lab that patient has gram positive cocci in 1/2 of the repeat blood cultures that were obtained on 09/23/16.  Unfortunately, the culture was obtained in a pediatric bottle and not enough of the sample to run a BCID.  Discussed with Dr. Caleb PoppNettey and no clinical signs of symptoms of untreated infection and will hold on adding vancomycin.  Will deescalate from Zosyn to Ceftriaxone 2 grams IV every 24 hours for the treatment of E.coli bacteremia.    Pollyann SamplesAndy Daneen Volcy, PharmD, BCPS 09/24/2016, 10:50 AM Pager: [phone]

## 2016-09-24 NOTE — Progress Notes (Signed)
Nsg Discharge Note  Admit Date:  09/22/2016 Discharge date: 09/24/2016   Kathy Young to be D/C'd Home per MD order.  AVS completed.  Copy for chart, and copy for patient signed, and dated. Patient/caregiver able to verbalize understanding.  Discharge Medication:   Medication List    TAKE these medications   ALPRAZolam 0.5 MG tablet Commonly known as:  XANAX Take 0.5 mg by mouth at bedtime.   atorvastatin 10 MG tablet Commonly known as:  LIPITOR Take 10 mg by mouth daily at 6 PM. For hypercholesteremia   baclofen 10 MG tablet Commonly known as:  LIORESAL Take 1 tablet (10 mg total) by mouth 3 (three) times daily. As needed for muscle spasm   cephALEXin 500 MG capsule Commonly known as:  KEFLEX Take 1 capsule (500 mg total) by mouth 4 (four) times daily.   cholecalciferol 1000 units tablet Commonly known as:  VITAMIN D Take 1,000 Units by mouth daily.   diclofenac sodium 1 % Gel Commonly known as:  VOLTAREN Apply 2 g topically 2 (two) times daily as needed (for pain).   escitalopram 20 MG tablet Commonly known as:  LEXAPRO Take 20 mg by mouth at bedtime. For depression   gabapentin 400 MG capsule Commonly known as:  NEURONTIN Take 400 mg by mouth 2 (two) times daily.   levothyroxine 200 MCG tablet Commonly known as:  SYNTHROID, LEVOTHROID Take 200 mcg by mouth daily before breakfast. **brand name only* For hypothyroidism   Lidocaine HCl 2 % Soln Use as directed 5 mLs in the mouth or throat 4 (four) times daily -  before meals and at bedtime.   lisinopril 20 MG tablet Commonly known as:  PRINIVIL,ZESTRIL Take 20 mg by mouth daily. For HTN   metFORMIN 500 MG 24 hr tablet Commonly known as:  GLUCOPHAGE-XR Take 500 mg by mouth 2 (two) times daily with a meal. For diabetes   multivitamin tablet Take 1 tablet by mouth daily.   ondansetron 4 MG tablet Commonly known as:  ZOFRAN Take 1 tablet (4 mg total) by mouth every 8 (eight) hours as needed for nausea or  vomiting.   oxybutynin 15 MG 24 hr tablet Commonly known as:  DITROPAN XL Take 15 mg by mouth every morning. For urinary incontinence   oxyCODONE-acetaminophen 10-325 MG tablet Commonly known as:  PERCOCET Take 1-2 tablets by mouth every 6 (six) hours as needed for pain. MAXIMUM TOTAL ACETAMINOPHEN DOSE IS 4000 MG PER DAY   rivaroxaban 10 MG Tabs tablet Commonly known as:  XARELTO Take 1 tablet (10 mg total) by mouth daily.   sennosides-docusate sodium 8.6-50 MG tablet Commonly known as:  SENOKOT-S Take 2 tablets by mouth daily. What changed:  how much to take   traMADol 50 MG tablet Commonly known as:  ULTRAM Take 50 mg by mouth 4 (four) times daily as needed for moderate pain.   valACYclovir 500 MG tablet Commonly known as:  VALTREX Take 1 tablet (500 mg total) by mouth daily. Start taking on:  09/25/2016 What changed:  Another medication with the same name was removed. Continue taking this medication, and follow the directions you see here.       Discharge Assessment: Vitals:   09/24/16 0500 09/24/16 1341  BP: (!) 117/51 (!) 113/47  Pulse: 60 65  Resp: 18   Temp: 98.4 F (36.9 C) 98.1 F (36.7 C)   Skin clean, dry and intact without evidence of skin break down, no evidence of skin tears noted.  IV catheter discontinued intact. Site without signs and symptoms of complications - no redness or edema noted at insertion site, patient denies c/o pain - only slight tenderness at site.  Dressing with slight pressure applied.  D/c Instructions-Education: Discharge instructions given to patient/family with verbalized understanding. D/c education completed with patient/family including follow up instructions, medication list, d/c activities limitations if indicated, with other d/c instructions as indicated by MD - patient able to verbalize understanding, all questions fully answered. Patient instructed to return to ED, call 911, or call MD for any changes in condition.  Patient  escorted via WC, and D/C home via private auto.  Camillo Flaming, RN 09/24/2016 5:59 PM

## 2016-09-25 ENCOUNTER — Telehealth: Payer: Self-pay | Admitting: Family Medicine

## 2016-09-25 LAB — TYPE AND SCREEN
ABO/RH(D): A POS
Antibody Screen: NEGATIVE
UNIT DIVISION: 0
Unit division: 0

## 2016-09-25 LAB — CULTURE, BLOOD (ROUTINE X 2)

## 2016-09-25 LAB — URINE CULTURE: Culture: 20000 — AB

## 2016-09-25 NOTE — Telephone Encounter (Signed)
Patient informed of blood culture results. No further follow-up unless repeat results positive.  Kathy Hawkingalph Kiowa Hollar, MD [facility] 09/25/2016, 4:41 PM Pager: [phone]

## 2016-09-27 LAB — CULTURE, BLOOD (ROUTINE X 2): CULTURE: NO GROWTH

## 2016-09-28 LAB — CULTURE, BLOOD (ROUTINE X 2): Culture: NO GROWTH

## 2016-09-29 DIAGNOSIS — M1712 Unilateral primary osteoarthritis, left knee: Secondary | ICD-10-CM | POA: Diagnosis not present

## 2016-09-29 DIAGNOSIS — M25662 Stiffness of left knee, not elsewhere classified: Secondary | ICD-10-CM | POA: Diagnosis not present

## 2016-09-29 DIAGNOSIS — R262 Difficulty in walking, not elsewhere classified: Secondary | ICD-10-CM | POA: Diagnosis not present

## 2016-09-29 DIAGNOSIS — M25562 Pain in left knee: Secondary | ICD-10-CM | POA: Diagnosis not present

## 2016-09-29 LAB — CULTURE, BLOOD (ROUTINE X 2)
CULTURE: NO GROWTH
CULTURE: NO GROWTH

## 2016-09-30 DIAGNOSIS — N898 Other specified noninflammatory disorders of vagina: Secondary | ICD-10-CM | POA: Diagnosis not present

## 2016-09-30 DIAGNOSIS — E119 Type 2 diabetes mellitus without complications: Secondary | ICD-10-CM | POA: Diagnosis not present

## 2016-09-30 DIAGNOSIS — N390 Urinary tract infection, site not specified: Secondary | ICD-10-CM | POA: Diagnosis not present

## 2016-09-30 DIAGNOSIS — E559 Vitamin D deficiency, unspecified: Secondary | ICD-10-CM | POA: Diagnosis not present

## 2016-09-30 DIAGNOSIS — D649 Anemia, unspecified: Secondary | ICD-10-CM | POA: Diagnosis not present

## 2016-10-01 DIAGNOSIS — M25662 Stiffness of left knee, not elsewhere classified: Secondary | ICD-10-CM | POA: Diagnosis not present

## 2016-10-01 DIAGNOSIS — M1712 Unilateral primary osteoarthritis, left knee: Secondary | ICD-10-CM | POA: Diagnosis not present

## 2016-10-01 DIAGNOSIS — R262 Difficulty in walking, not elsewhere classified: Secondary | ICD-10-CM | POA: Diagnosis not present

## 2016-10-01 DIAGNOSIS — M25562 Pain in left knee: Secondary | ICD-10-CM | POA: Diagnosis not present

## 2016-10-04 DIAGNOSIS — M1712 Unilateral primary osteoarthritis, left knee: Secondary | ICD-10-CM | POA: Diagnosis not present

## 2016-10-06 DIAGNOSIS — M1712 Unilateral primary osteoarthritis, left knee: Secondary | ICD-10-CM | POA: Diagnosis not present

## 2016-10-06 DIAGNOSIS — R262 Difficulty in walking, not elsewhere classified: Secondary | ICD-10-CM | POA: Diagnosis not present

## 2016-10-06 DIAGNOSIS — M25662 Stiffness of left knee, not elsewhere classified: Secondary | ICD-10-CM | POA: Diagnosis not present

## 2016-10-06 DIAGNOSIS — M25562 Pain in left knee: Secondary | ICD-10-CM | POA: Diagnosis not present

## 2016-10-08 DIAGNOSIS — M25562 Pain in left knee: Secondary | ICD-10-CM | POA: Diagnosis not present

## 2016-10-08 DIAGNOSIS — R262 Difficulty in walking, not elsewhere classified: Secondary | ICD-10-CM | POA: Diagnosis not present

## 2016-10-08 DIAGNOSIS — M25662 Stiffness of left knee, not elsewhere classified: Secondary | ICD-10-CM | POA: Diagnosis not present

## 2016-10-08 DIAGNOSIS — M1712 Unilateral primary osteoarthritis, left knee: Secondary | ICD-10-CM | POA: Diagnosis not present

## 2016-10-11 DIAGNOSIS — M25662 Stiffness of left knee, not elsewhere classified: Secondary | ICD-10-CM | POA: Diagnosis not present

## 2016-10-11 DIAGNOSIS — M25562 Pain in left knee: Secondary | ICD-10-CM | POA: Diagnosis not present

## 2016-10-11 DIAGNOSIS — M1712 Unilateral primary osteoarthritis, left knee: Secondary | ICD-10-CM | POA: Diagnosis not present

## 2016-10-11 DIAGNOSIS — R262 Difficulty in walking, not elsewhere classified: Secondary | ICD-10-CM | POA: Diagnosis not present

## 2016-10-13 DIAGNOSIS — M25562 Pain in left knee: Secondary | ICD-10-CM | POA: Diagnosis not present

## 2016-10-13 DIAGNOSIS — M1712 Unilateral primary osteoarthritis, left knee: Secondary | ICD-10-CM | POA: Diagnosis not present

## 2016-10-13 DIAGNOSIS — M25662 Stiffness of left knee, not elsewhere classified: Secondary | ICD-10-CM | POA: Diagnosis not present

## 2016-10-13 DIAGNOSIS — R262 Difficulty in walking, not elsewhere classified: Secondary | ICD-10-CM | POA: Diagnosis not present

## 2016-10-15 DIAGNOSIS — M25562 Pain in left knee: Secondary | ICD-10-CM | POA: Diagnosis not present

## 2016-10-15 DIAGNOSIS — M25662 Stiffness of left knee, not elsewhere classified: Secondary | ICD-10-CM | POA: Diagnosis not present

## 2016-10-15 DIAGNOSIS — M1712 Unilateral primary osteoarthritis, left knee: Secondary | ICD-10-CM | POA: Diagnosis not present

## 2016-10-15 DIAGNOSIS — R262 Difficulty in walking, not elsewhere classified: Secondary | ICD-10-CM | POA: Diagnosis not present

## 2016-10-18 DIAGNOSIS — M1712 Unilateral primary osteoarthritis, left knee: Secondary | ICD-10-CM | POA: Diagnosis not present

## 2016-10-18 DIAGNOSIS — M25562 Pain in left knee: Secondary | ICD-10-CM | POA: Diagnosis not present

## 2016-10-18 DIAGNOSIS — M25662 Stiffness of left knee, not elsewhere classified: Secondary | ICD-10-CM | POA: Diagnosis not present

## 2016-10-18 DIAGNOSIS — R262 Difficulty in walking, not elsewhere classified: Secondary | ICD-10-CM | POA: Diagnosis not present

## 2016-10-20 DIAGNOSIS — M25562 Pain in left knee: Secondary | ICD-10-CM | POA: Diagnosis not present

## 2016-10-20 DIAGNOSIS — M25662 Stiffness of left knee, not elsewhere classified: Secondary | ICD-10-CM | POA: Diagnosis not present

## 2016-10-20 DIAGNOSIS — M1712 Unilateral primary osteoarthritis, left knee: Secondary | ICD-10-CM | POA: Diagnosis not present

## 2016-10-20 DIAGNOSIS — R262 Difficulty in walking, not elsewhere classified: Secondary | ICD-10-CM | POA: Diagnosis not present

## 2016-10-22 DIAGNOSIS — M1712 Unilateral primary osteoarthritis, left knee: Secondary | ICD-10-CM | POA: Diagnosis not present

## 2016-10-22 DIAGNOSIS — R262 Difficulty in walking, not elsewhere classified: Secondary | ICD-10-CM | POA: Diagnosis not present

## 2016-10-22 DIAGNOSIS — M25562 Pain in left knee: Secondary | ICD-10-CM | POA: Diagnosis not present

## 2016-10-22 DIAGNOSIS — M25662 Stiffness of left knee, not elsewhere classified: Secondary | ICD-10-CM | POA: Diagnosis not present

## 2016-10-25 DIAGNOSIS — R262 Difficulty in walking, not elsewhere classified: Secondary | ICD-10-CM | POA: Diagnosis not present

## 2016-10-25 DIAGNOSIS — M25662 Stiffness of left knee, not elsewhere classified: Secondary | ICD-10-CM | POA: Diagnosis not present

## 2016-10-25 DIAGNOSIS — M1712 Unilateral primary osteoarthritis, left knee: Secondary | ICD-10-CM | POA: Diagnosis not present

## 2016-10-25 DIAGNOSIS — M25562 Pain in left knee: Secondary | ICD-10-CM | POA: Diagnosis not present

## 2016-10-29 DIAGNOSIS — M1712 Unilateral primary osteoarthritis, left knee: Secondary | ICD-10-CM | POA: Diagnosis not present

## 2016-10-29 DIAGNOSIS — R262 Difficulty in walking, not elsewhere classified: Secondary | ICD-10-CM | POA: Diagnosis not present

## 2016-10-29 DIAGNOSIS — M25662 Stiffness of left knee, not elsewhere classified: Secondary | ICD-10-CM | POA: Diagnosis not present

## 2016-10-29 DIAGNOSIS — M25562 Pain in left knee: Secondary | ICD-10-CM | POA: Diagnosis not present

## 2016-11-01 DIAGNOSIS — M25662 Stiffness of left knee, not elsewhere classified: Secondary | ICD-10-CM | POA: Diagnosis not present

## 2016-11-01 DIAGNOSIS — R262 Difficulty in walking, not elsewhere classified: Secondary | ICD-10-CM | POA: Diagnosis not present

## 2016-11-01 DIAGNOSIS — M1712 Unilateral primary osteoarthritis, left knee: Secondary | ICD-10-CM | POA: Diagnosis not present

## 2016-11-01 DIAGNOSIS — M25562 Pain in left knee: Secondary | ICD-10-CM | POA: Diagnosis not present

## 2016-11-03 DIAGNOSIS — R262 Difficulty in walking, not elsewhere classified: Secondary | ICD-10-CM | POA: Diagnosis not present

## 2016-11-03 DIAGNOSIS — M25662 Stiffness of left knee, not elsewhere classified: Secondary | ICD-10-CM | POA: Diagnosis not present

## 2016-11-03 DIAGNOSIS — M1712 Unilateral primary osteoarthritis, left knee: Secondary | ICD-10-CM | POA: Diagnosis not present

## 2016-11-03 DIAGNOSIS — M25562 Pain in left knee: Secondary | ICD-10-CM | POA: Diagnosis not present

## 2016-11-08 DIAGNOSIS — R262 Difficulty in walking, not elsewhere classified: Secondary | ICD-10-CM | POA: Diagnosis not present

## 2016-11-08 DIAGNOSIS — M25662 Stiffness of left knee, not elsewhere classified: Secondary | ICD-10-CM | POA: Diagnosis not present

## 2016-11-08 DIAGNOSIS — M25562 Pain in left knee: Secondary | ICD-10-CM | POA: Diagnosis not present

## 2016-11-08 DIAGNOSIS — M1712 Unilateral primary osteoarthritis, left knee: Secondary | ICD-10-CM | POA: Diagnosis not present

## 2016-11-15 DIAGNOSIS — M1712 Unilateral primary osteoarthritis, left knee: Secondary | ICD-10-CM | POA: Diagnosis not present

## 2017-01-13 DIAGNOSIS — D509 Iron deficiency anemia, unspecified: Secondary | ICD-10-CM | POA: Diagnosis not present

## 2017-01-13 DIAGNOSIS — M170 Bilateral primary osteoarthritis of knee: Secondary | ICD-10-CM | POA: Diagnosis not present

## 2017-01-13 DIAGNOSIS — E039 Hypothyroidism, unspecified: Secondary | ICD-10-CM | POA: Diagnosis not present

## 2017-01-13 DIAGNOSIS — G4733 Obstructive sleep apnea (adult) (pediatric): Secondary | ICD-10-CM | POA: Diagnosis not present

## 2017-01-13 DIAGNOSIS — E785 Hyperlipidemia, unspecified: Secondary | ICD-10-CM | POA: Diagnosis not present

## 2017-01-13 DIAGNOSIS — I10 Essential (primary) hypertension: Secondary | ICD-10-CM | POA: Diagnosis not present

## 2017-01-13 DIAGNOSIS — E119 Type 2 diabetes mellitus without complications: Secondary | ICD-10-CM | POA: Diagnosis not present

## 2017-01-13 DIAGNOSIS — F329 Major depressive disorder, single episode, unspecified: Secondary | ICD-10-CM | POA: Diagnosis not present

## 2017-01-31 DIAGNOSIS — Z7984 Long term (current) use of oral hypoglycemic drugs: Secondary | ICD-10-CM | POA: Diagnosis not present

## 2017-01-31 DIAGNOSIS — E119 Type 2 diabetes mellitus without complications: Secondary | ICD-10-CM | POA: Diagnosis not present

## 2017-01-31 DIAGNOSIS — Z96653 Presence of artificial knee joint, bilateral: Secondary | ICD-10-CM | POA: Diagnosis not present

## 2017-01-31 DIAGNOSIS — E669 Obesity, unspecified: Secondary | ICD-10-CM | POA: Diagnosis not present

## 2017-01-31 DIAGNOSIS — E039 Hypothyroidism, unspecified: Secondary | ICD-10-CM | POA: Diagnosis not present

## 2017-01-31 DIAGNOSIS — Z6837 Body mass index (BMI) 37.0-37.9, adult: Secondary | ICD-10-CM | POA: Diagnosis not present

## 2017-03-07 DIAGNOSIS — E039 Hypothyroidism, unspecified: Secondary | ICD-10-CM | POA: Diagnosis not present

## 2017-03-17 ENCOUNTER — Telehealth: Payer: Self-pay | Admitting: Family Medicine

## 2017-03-17 NOTE — Telephone Encounter (Signed)
pt states she last saw pcp in nov or dec 2017; states Dr. Jillyn HiddenFulp has transferred away from that practice but she is seeing someone new at same [place] office. VDM (DD)

## 2017-03-29 IMAGING — CR DG CHEST 1V PORT
1 series · 1 of 1 positions shown · non-contrast
Comparison: Chest radiograph dated 09/02/2015

CLINICAL DATA: 70-year-old female with fever and hypotension.

EXAM:
PORTABLE CHEST 1 VIEW

[AP]
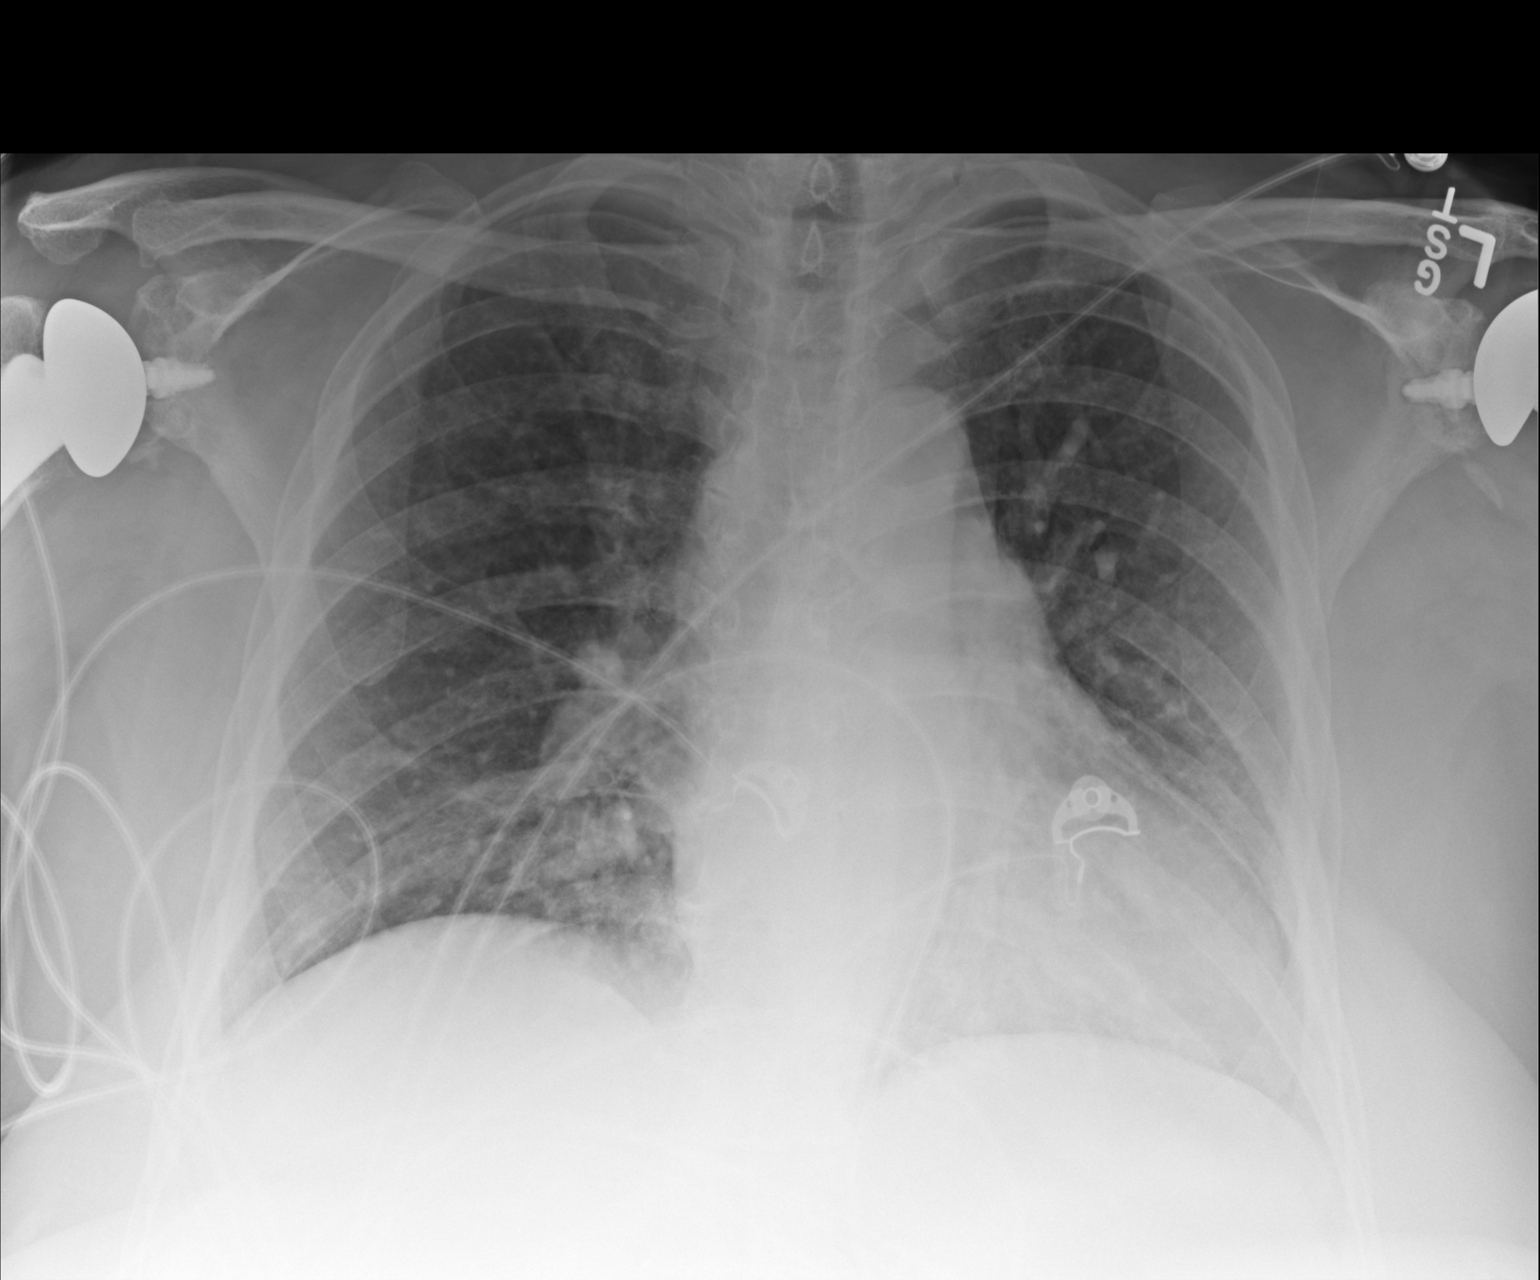

[1 of 1 positions shown; findings below may reference images not displayed]

FINDINGS: There is mild cardiomegaly with increased central vascular
prominence. There is no focal consolidation, pleural effusion, or
pneumothorax. Bilateral shoulder arthroplasties. No acute fracture.
IMPRESSION: Stable cardiomegaly with mild vascular congestion. No focal
consolidation.

## 2017-04-28 DIAGNOSIS — Z1389 Encounter for screening for other disorder: Secondary | ICD-10-CM | POA: Diagnosis not present

## 2017-04-28 DIAGNOSIS — G4733 Obstructive sleep apnea (adult) (pediatric): Secondary | ICD-10-CM | POA: Diagnosis not present

## 2017-04-28 DIAGNOSIS — B001 Herpesviral vesicular dermatitis: Secondary | ICD-10-CM | POA: Diagnosis not present

## 2017-04-28 DIAGNOSIS — E119 Type 2 diabetes mellitus without complications: Secondary | ICD-10-CM | POA: Diagnosis not present

## 2017-04-28 DIAGNOSIS — R0989 Other specified symptoms and signs involving the circulatory and respiratory systems: Secondary | ICD-10-CM | POA: Diagnosis not present

## 2017-04-28 DIAGNOSIS — E559 Vitamin D deficiency, unspecified: Secondary | ICD-10-CM | POA: Diagnosis not present

## 2017-04-28 DIAGNOSIS — L853 Xerosis cutis: Secondary | ICD-10-CM | POA: Diagnosis not present

## 2017-04-28 DIAGNOSIS — I10 Essential (primary) hypertension: Secondary | ICD-10-CM | POA: Diagnosis not present

## 2017-04-28 DIAGNOSIS — E039 Hypothyroidism, unspecified: Secondary | ICD-10-CM | POA: Diagnosis not present

## 2017-06-27 DIAGNOSIS — K0889 Other specified disorders of teeth and supporting structures: Secondary | ICD-10-CM | POA: Diagnosis not present

## 2017-08-23 DIAGNOSIS — Z1231 Encounter for screening mammogram for malignant neoplasm of breast: Secondary | ICD-10-CM | POA: Diagnosis not present

## 2017-08-26 DIAGNOSIS — Z23 Encounter for immunization: Secondary | ICD-10-CM | POA: Diagnosis not present

## 2017-09-06 DIAGNOSIS — H43813 Vitreous degeneration, bilateral: Secondary | ICD-10-CM | POA: Diagnosis not present

## 2017-09-06 DIAGNOSIS — H52223 Regular astigmatism, bilateral: Secondary | ICD-10-CM | POA: Diagnosis not present

## 2017-09-06 DIAGNOSIS — H2513 Age-related nuclear cataract, bilateral: Secondary | ICD-10-CM | POA: Diagnosis not present

## 2017-09-06 DIAGNOSIS — H524 Presbyopia: Secondary | ICD-10-CM | POA: Diagnosis not present

## 2017-09-06 DIAGNOSIS — H5211 Myopia, right eye: Secondary | ICD-10-CM | POA: Diagnosis not present

## 2017-09-06 DIAGNOSIS — E119 Type 2 diabetes mellitus without complications: Secondary | ICD-10-CM | POA: Diagnosis not present

## 2017-09-06 DIAGNOSIS — H25813 Combined forms of age-related cataract, bilateral: Secondary | ICD-10-CM | POA: Diagnosis not present

## 2017-09-30 DIAGNOSIS — M25441 Effusion, right hand: Secondary | ICD-10-CM | POA: Diagnosis not present

## 2017-09-30 DIAGNOSIS — E039 Hypothyroidism, unspecified: Secondary | ICD-10-CM | POA: Diagnosis not present

## 2017-09-30 DIAGNOSIS — F329 Major depressive disorder, single episode, unspecified: Secondary | ICD-10-CM | POA: Diagnosis not present

## 2017-09-30 DIAGNOSIS — Z7984 Long term (current) use of oral hypoglycemic drugs: Secondary | ICD-10-CM | POA: Diagnosis not present

## 2017-09-30 DIAGNOSIS — E785 Hyperlipidemia, unspecified: Secondary | ICD-10-CM | POA: Diagnosis not present

## 2017-09-30 DIAGNOSIS — I10 Essential (primary) hypertension: Secondary | ICD-10-CM | POA: Diagnosis not present

## 2017-09-30 DIAGNOSIS — E119 Type 2 diabetes mellitus without complications: Secondary | ICD-10-CM | POA: Diagnosis not present
